# Patient Record
Sex: Male | Born: 1990 | Race: Black or African American | Hispanic: No | Marital: Single | Smoking: Never smoker
Health system: Southern US, Community
[De-identification: ages and names within clinical notes are randomized; demographics above are authoritative.]

## PROBLEM LIST (undated history)

## (undated) DIAGNOSIS — I456 Pre-excitation syndrome: Secondary | ICD-10-CM

## (undated) DIAGNOSIS — R002 Palpitations: Secondary | ICD-10-CM

## (undated) DIAGNOSIS — J45909 Unspecified asthma, uncomplicated: Secondary | ICD-10-CM

## (undated) DIAGNOSIS — R011 Cardiac murmur, unspecified: Secondary | ICD-10-CM

## (undated) HISTORY — DX: Pre-excitation syndrome: I45.6

## (undated) HISTORY — DX: Palpitations: R00.2

---
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
## 2012-05-08 ENCOUNTER — Emergency Department (INDEPENDENT_AMBULATORY_CARE_PROVIDER_SITE_OTHER)
Admission: EM | Admit: 2012-05-08 | Discharge: 2012-05-08 | Disposition: A | Discharge status: Home / Self Care | Payer: Self-pay | Source: Home / Self Care | Attending: Emergency Medicine | Admitting: Emergency Medicine

## 2012-05-08 ENCOUNTER — Encounter (HOSPITAL_COMMUNITY): Payer: Self-pay | Admitting: Emergency Medicine

## 2012-05-08 DIAGNOSIS — F410 Panic disorder [episodic paroxysmal anxiety] without agoraphobia: Secondary | ICD-10-CM

## 2012-05-08 MED ORDER — LORAZEPAM 2 MG/ML IJ SOLN
INTRAMUSCULAR | Status: AC
  Filled 2012-05-08: qty 1

## 2012-05-08 MED ORDER — HYDROXYZINE HCL 25 MG PO TABS: 25.0000 mg | ORAL_TABLET | Freq: Four times a day (QID) | ORAL | Status: DC

## 2012-05-08 MED ORDER — LORAZEPAM 2 MG/ML IJ SOLN
1.0000 mg | Freq: Once | INTRAMUSCULAR | Status: AC
  Administered 2012-05-08: 1 mg via INTRAVENOUS

## 2012-05-08 NOTE — ED Notes (Addendum)
Pt c/o Heart racing and irregular heartbeat onset this morning around 1100. Pt states he was not doing anything physical or stressful. No history of similar symptoms. Has been drinking energy drinks regularly but not in the last 24 hrs. Pt is alert and oriented.

## 2012-05-08 NOTE — ED Provider Notes (Signed)
Chief Complaint:   Chief Complaint  Patient presents with  . Palpitations    History of Present Illness:   [place] Grosser is a 22 year old male who has had a sensation of his heart racing and skipping beats since 11 AM today. The patient states this is still going on right now despite a normal pulse and normal EKG. He's had episodes like this off and on for the past year. He thinks they're sometimes related to stress. He also tried some marijuana and thinks this might have precipitated this attack. The patient states he feels "weird" describing dizziness, nausea, and tremulousness. He feels anxious and somewhat panicky. He denies any chest pain but states he feels "weak" in his left chest and abdomen. He's had no shortness of breath, coughing, or wheezing. No syncope or presyncope. He denies nausea or diaphoresis. There's been no vomiting or abdominal pain. He denies any depression. He states he has felt somewhat anxious about several decisions he has to make. He does not use excessive caffeine and denies any use of recreational drugs. He does drink but no more than once a week although he states he sometimes drinks heavily.  Review of Systems:  Other than noted above, the patient denies any of the following symptoms. Systemic:  No fever, chills, or fatigue. Pulmonary:  No cough, wheezing, shortness of breath. Cardiac:  No chest pain, tightness, pressure, dizziness, presyncope, syncope, PND, orthopnea, or edema. Ext:  No leg pain or swelling. Neuro:  No weakness, paresthesias, or difficulty with speech or gait. Psych:  No anxiety or depression. Endo:  No weight loss, tremor, sweats, or heat intolerance.  PMFSH:  Past medical history, family history, social history, meds, and allergies were reviewed and updated as needed. No history of cardiac disease.  No history of excessive alcohol intake.    Physical Exam:   Vital signs:  BP 131/64  Pulse 105  Temp(Src) 98.2 F (36.8 C) (Oral)  Resp 16   SpO2 100% Gen:  Alert, oriented, in no distress, skin warm and dry. Eye:  PERRL, lids and conjunctivas normal.  No stare or lid lag. ENT:  Mucous membranes moist, pharynx clear. Neck:  Supple, no adenopathy or tenderness.  No JVD.  Thyroid not enlarged. Lungs:  Clear to auscultation, no wheezes, rales or rhonchi.  No respiratory distress. Heart:  Regular rhythm, no extrasystoles.  No gallops, murmers, clicks or rubs. Abdomen:  Soft, nontender, no organomegaly or mass.  Bowel sounds normal.  No pulsatile abdominal mass or bruit. Ext:  No edema. Pulses full and equal. Skin:  Warm and dry.  No rash.  EKG:   Date: 05/08/2012  Rate: 99  Rhythm: sinus arrhythmia  QRS Axis: normal  Intervals: PR shortened  ST/T Wave abnormalities: normal  Conduction Disutrbances:none  Narrative Interpretation: Sinus rhythm with sinus arrhythmia and short PR interval, nonspecific intraventricular conduction block, inferior infarction, age undetermined, cannot rule out anterior infarct, age undetermined.  Old EKG Reviewed: none available  Course in Urgent Care Center:    Given lorazepam 1 mg IM.  Assessment:  The encounter diagnosis was Panic attack.  There is no evidence of atrial or ventricular arrhythmia he does have some changes on his EKG, with a short PR interval, this may predispose to reentrant tachycardia. I recommended a followup with his primary care physician about this and suggested a referral to a cardiologist.  Plan:   1.  The following meds were prescribed:   Discharge Medication List as of 05/08/2012  1:18 PM  START taking these medications   Details  hydrOXYzine (ATARAX/VISTARIL) 25 MG tablet Take 1 tablet (25 mg total) by mouth every 6 (six) hours., Starting 05/08/2012, Until Discontinued, Normal       2.  The patient was instructed in symptomatic care and handouts were given. 3.  The patient was told to return if becoming worse in any way, if no better in 3 or 4 days, and given some  red flag symptoms including syncope, presyncope, dyspnea, or chest pain that would indicate earlier return.  Follow up:  The patient was told to follow up with his pediatrician, suggest he discuss referral to a cardiologist.     Reuben Likes, MD 05/08/12 2151

## 2012-05-10 ENCOUNTER — Telehealth (HOSPITAL_COMMUNITY): Payer: Self-pay | Admitting: Emergency Medicine

## 2012-05-11 ENCOUNTER — Encounter (HOSPITAL_COMMUNITY): Payer: Self-pay | Admitting: Family Medicine

## 2012-05-11 ENCOUNTER — Emergency Department (HOSPITAL_COMMUNITY): Payer: BC Managed Care – PPO

## 2012-05-11 ENCOUNTER — Emergency Department (HOSPITAL_COMMUNITY)
Admission: EM | Admit: 2012-05-11 | Discharge: 2012-05-11 | Disposition: A | Discharge status: Home / Self Care | Payer: BC Managed Care – PPO | Attending: Emergency Medicine | Admitting: Emergency Medicine

## 2012-05-11 DIAGNOSIS — I456 Pre-excitation syndrome: Secondary | ICD-10-CM

## 2012-05-11 DIAGNOSIS — J45909 Unspecified asthma, uncomplicated: Secondary | ICD-10-CM | POA: Insufficient documentation

## 2012-05-11 DIAGNOSIS — R0789 Other chest pain: Secondary | ICD-10-CM | POA: Insufficient documentation

## 2012-05-11 DIAGNOSIS — R42 Dizziness and giddiness: Secondary | ICD-10-CM | POA: Insufficient documentation

## 2012-05-11 LAB — CBC
HCT: 44 % (ref 39.0–52.0)
Hemoglobin: 17.1 g/dL — ABNORMAL HIGH (ref 13.0–17.0)
MCH: 31.8 pg (ref 26.0–34.0)
MCHC: 38.9 g/dL — ABNORMAL HIGH (ref 30.0–36.0)
MCV: 81.8 fL (ref 78.0–100.0)
Platelets: 124 10*3/uL — ABNORMAL LOW (ref 150–400)
RBC: 5.38 MIL/uL (ref 4.22–5.81)
RDW: 13.3 % (ref 11.5–15.5)
WBC: 9.7 10*3/uL (ref 4.0–10.5)

## 2012-05-11 LAB — PRO B NATRIURETIC PEPTIDE: Pro B Natriuretic peptide (BNP): <5 pg/mL (ref 0–125)

## 2012-05-11 LAB — BASIC METABOLIC PANEL
BUN: 11 mg/dL (ref 6–23)
CO2: 29 mEq/L (ref 19–32)
Calcium: 9.9 mg/dL (ref 8.4–10.5)
Chloride: 100 mEq/L (ref 96–112)
Creatinine, Ser: 1.08 mg/dL (ref 0.50–1.35)
GFR calc Af Amer: >90 mL/min (ref >90)
GFR calc non Af Amer: >90 mL/min (ref >90)
Glucose, Bld: 105 mg/dL — ABNORMAL HIGH (ref 70–99)
Potassium: 4.2 mEq/L (ref 3.5–5.1)
Sodium: 138 mEq/L (ref 135–145)

## 2012-05-11 LAB — POCT I-STAT TROPONIN I: Troponin i, poc: 0 ng/mL (ref 0.00–0.08)

## 2012-05-11 MED ORDER — SODIUM CHLORIDE 0.9 % IV SOLN
INTRAVENOUS | Status: DC
  Administered 2012-05-11: 19:00:00 via INTRAVENOUS
  Administered 2012-05-11: 500 mL via INTRAVENOUS

## 2012-05-11 MED ORDER — SODIUM CHLORIDE 0.9 % IV BOLUS (SEPSIS)
500.0000 mL | Freq: Once | INTRAVENOUS | Status: AC
  Administered 2012-05-11: 500 mL via INTRAVENOUS

## 2012-05-11 MED ORDER — LORAZEPAM 2 MG/ML IJ SOLN
1.0000 mg | Freq: Once | INTRAMUSCULAR | Status: AC
  Administered 2012-05-11: 1 mg via INTRAVENOUS
  Filled 2012-05-11 (×2): qty 1

## 2012-05-11 NOTE — ED Notes (Signed)
Per pt sts heart palpitations worse over the weekend. Left sided chest pressure. sts dizziness. Has appt to see cardiology May 13th but was told if worse to come here. sts was told abnormality EKG.

## 2012-05-13 NOTE — ED Provider Notes (Signed)
History     CSN: [id number]  Arrival date & time 05/11/12  1658   First MD Initiated Contact with Patient 05/11/12 1725      Chief Complaint  Patient presents with  . Palpitations    (Consider location/radiation/quality/duration/timing/severity/associated sxs/prior treatment) Patient is a 22 y.o. male presenting with palpitations. The history is provided by the patient and a parent.  Palpitations  This is a recurrent problem. Associated symptoms include dizziness. Pertinent negatives include no fever, no headaches, no back pain and no shortness of breath.  Seen in Urgent Care for simialr complaint on 4/10 and referred to Cardiology - has appointment May 13. Presents with recurrent feeling of palpitations, left chest pressure and some dizziness. No syncope. Pressure in chest is 3/10 and nonradiating and not made worse or better by anything.   Past Medical History  Diagnosis Date  . Asthma     History reviewed. No pertinent past surgical history.  History reviewed. No pertinent family history.  History  Substance Use Topics  . Smoking status: Never Smoker   . Smokeless tobacco: Current User     Comment: hookah pipe daily  . Alcohol Use: 0.0 oz/week    1-2 Cans of beer per week     Comment: 1-2 times a week       Review of Systems  Constitutional: Negative for fever.  HENT: Negative for congestion and neck pain.   Eyes: Negative for visual disturbance.  Respiratory: Positive for chest tightness. Negative for shortness of breath.   Cardiovascular: Positive for palpitations. Negative for leg swelling.  Genitourinary: Negative for hematuria.  Musculoskeletal: Negative for back pain.  Skin: Negative for rash.  Neurological: Positive for dizziness. Negative for headaches.  Hematological: Does not bruise/bleed easily.  Psychiatric/Behavioral: Negative for confusion.    Allergies  Review of patient's allergies indicates no known allergies.  Home Medications   Current  Outpatient Rx  Name  Route  Sig  Dispense  Refill  . hydrOXYzine (ATARAX/VISTARIL) 25 MG tablet   Oral   Take 1 tablet (25 mg total) by mouth every 6 (six) hours.   20 tablet   0   . ibuprofen (ADVIL,MOTRIN) 800 MG tablet   Oral   Take 1 tablet (800 mg total) by mouth every 8 (eight) hours as needed for pain.   15 tablet   0     BP 123/70  Pulse 97  Temp(Src) 99 F (37.2 C) (Oral)  Resp 22  SpO2 100%  Physical Exam  Nursing note and vitals reviewed. Constitutional: He is oriented to person, place, and time. He appears well-developed and well-nourished. No distress.  HENT:  Head: Normocephalic and atraumatic.  Eyes: Conjunctivae and EOM are normal. Pupils are equal, round, and reactive to light.  Neck: Normal range of motion.  Cardiovascular: Normal heart sounds and intact distal pulses.   No murmur heard. Regular tachycardia.  Pulmonary/Chest: Effort normal and breath sounds normal. No respiratory distress.  Abdominal: Soft. Bowel sounds are normal. There is no tenderness.  Musculoskeletal: Normal range of motion. He exhibits no edema.  Neurological: He is alert and oriented to person, place, and time. No cranial nerve deficit. He exhibits normal muscle tone. Coordination normal.  Skin: Skin is warm. No rash noted.    ED Course  Procedures (including critical care time)  Labs Reviewed  CBC - Abnormal; Notable for the following:    Hemoglobin 17.1 (*)    MCHC 38.9 (*)    Platelets 124 (*)  All other components within normal limits  BASIC METABOLIC PANEL - Abnormal; Notable for the following:    Glucose, Bld 105 (*)    All other components within normal limits  PRO B NATRIURETIC PEPTIDE  POCT I-STAT TROPONIN I   Dg Chest Port 1 View  05/11/2012  *RADIOLOGY REPORT*  Clinical Data: Shortness of breath.  PORTABLE CHEST - 1 VIEW  Comparison: None.  Findings: Lungs clear.  Heart size normal.  No pneumothorax or pleural effusion.  IMPRESSION: Negative chest.    Original Report Authenticated By: Holley Dexter, M.D.    Results for orders placed during the hospital encounter of 05/11/12  CBC      Result Value Range   WBC 9.7  4.0 - 10.5 K/uL   RBC 5.38  4.22 - 5.81 MIL/uL   Hemoglobin 17.1 (*) 13.0 - 17.0 g/dL   HCT 40.9  81.1 - 91.4 %   MCV 81.8  78.0 - 100.0 fL   MCH 31.8  26.0 - 34.0 pg   MCHC 38.9 (*) 30.0 - 36.0 g/dL   RDW 78.2  95.6 - 21.3 %   Platelets 124 (*) 150 - 400 K/uL  BASIC METABOLIC PANEL      Result Value Range   Sodium 138  135 - 145 mEq/L   Potassium 4.2  3.5 - 5.1 mEq/L   Chloride 100  96 - 112 mEq/L   CO2 29  19 - 32 mEq/L   Glucose, Bld 105 (*) 70 - 99 mg/dL   BUN 11  6 - 23 mg/dL   Creatinine, Ser 0.86  0.50 - 1.35 mg/dL   Calcium 9.9  8.4 - 57.8 mg/dL   GFR calc non Af Amer >90  >90 mL/min   GFR calc Af Amer >90  >90 mL/min  PRO B NATRIURETIC PEPTIDE      Result Value Range   Pro B Natriuretic peptide (BNP) <5.0  0 - 125 pg/mL  POCT I-STAT TROPONIN I      Result Value Range   Troponin i, poc 0.00  0.00 - 0.08 ng/mL   Comment 3             Date: 05/13/2012  Rate: 95  Rhythm: sinus rhythm  QRS Axis: Nl  Intervals: Nl  ST/T Wave abnormalities: Nonspecific ST and T wave changes consistent with WPW  Conduction Disutrbances: Short PR and prolonged QRS  Narrative Interpretation:   Old EKG Reviewed: No significant change from 05/08/12 This was second EKG here today first suggestive of WPW but had artifact.    1. Wolff-Parkinson-White (WPW) syndrome       MDM  Extensive monitoring in ED and no tachyarrythmia only sinus tachycardia at times and at fastest rate of 120. EKG consistent with WPW and suspect related to patients symptoms. Has cardiology follow up. No immediate need for intervention or medication at this time. CXR negative for pneumonia, pneumothorax, or pulmonary edema.         Shelda Jakes, MD 05/13/12 1351

## 2012-05-19 ENCOUNTER — Encounter: Payer: Self-pay | Admitting: Internal Medicine

## 2012-05-19 ENCOUNTER — Ambulatory Visit (INDEPENDENT_AMBULATORY_CARE_PROVIDER_SITE_OTHER): Payer: BC Managed Care – PPO | Admitting: Internal Medicine

## 2012-05-19 VITALS — BP 141/72 | HR 90 | Ht 77.0 in | Wt 261.0 lb

## 2012-05-19 DIAGNOSIS — R6889 Other general symptoms and signs: Secondary | ICD-10-CM

## 2012-05-19 DIAGNOSIS — R002 Palpitations: Secondary | ICD-10-CM

## 2012-05-19 DIAGNOSIS — I456 Pre-excitation syndrome: Secondary | ICD-10-CM

## 2012-05-19 DIAGNOSIS — Z0001 Encounter for general adult medical examination with abnormal findings: Secondary | ICD-10-CM | POA: Insufficient documentation

## 2012-05-19 NOTE — Patient Instructions (Signed)
Your physician has requested that you have an echocardiogram this week. Echocardiography is a painless test that uses sound waves to create images of your heart. It provides your doctor with information about the size and shape of your heart and how well your heart's chambers and valves are working. This procedure takes approximately one hour. There are no restrictions for this procedure.

## 2012-05-19 NOTE — Assessment & Plan Note (Signed)
As above.

## 2012-05-19 NOTE — Progress Notes (Signed)
ELECTROPHYSIOLOGY CONSULT NOTE  Patient ID: Wayne Gomez, MRN: [id number], DOB/AGE: 18-May-1990 22 y.o. Admit date: (Not on file) Date of Consult: 05/19/2012  Primary Physician: Lyda Perone, MD Primary Cardiologist:  new  Chief Complaint:  WPW   HPI Wayne Gomez is a 22 y.o. male  Seen today with his mother. He comes in having presented to the emergency room placed in the last 2 weeks because of palpitations and chest pain.  His first presentation was associated with palpitations which have largely as needed by the time he arrived. ECG at that time demonstrated sinus rhythm. He was seen a couple of days later again with palpitations. At that time it is felt excision for ongoing. ECG demonstrated sinus rhythm at about 105 beats per minute.  The patient has no history of sustained tachypalpitations. He has no history of syncope. There has been no history of sustained rapid rates following exercise.  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  Past Medical History  Diagnosis Date  . Asthma       Surgical History: No past surgical history on file.   Home Meds: Prior to Admission medications   Medication Sig Start Date End Date Taking? Authorizing Provider  albuterol (PROVENTIL HFA;VENTOLIN HFA) 108 (90 BASE) MCG/ACT inhaler Inhale 2 puffs into the lungs every 6 (six) hours as needed for wheezing.   Yes Historical Provider, MD  ibuprofen (ADVIL,MOTRIN) 800 MG tablet Take 1 tablet (800 mg total) by mouth every 8 (eight) hours as needed for pain. 12/04/11  Yes Trixie Dredge, PA-C    *  Allergies: No Known  Allergies  History   Social History  . Marital Status: Single    Spouse Name: N/A    Number of Children: N/A  . Years of Education: N/A   Occupational History  . Not on file.   Social History Main Topics  . Smoking status: Never Smoker   . Smokeless tobacco: Current User     Comment: hookah pipe daily  . Alcohol Use: 0.0 oz/week    1-2 Cans of beer per week     Comment: 1-2 times a week   . Drug Use: No  . Sexually Active: Not on file   Other Topics Concern  . Not on file   Social History Narrative  . No narrative on file     No family history on file.   ROS:  Please see the history of present illness.   Negative except stress  All other systems reviewed and negative.    Physical Exam:   Blood pressure 141/72, pulse 90, height 6\' 5"  (1.956 m), weight 261 lb (118.389 kg). General: Well developed, well nourished male in no acute distress. Head: Normocephalic, atraumatic, sclera non-icteric, no xanthomas, nares are without discharge. EENT: normal Lymph Nodes:  none Back: without scoliosis/kyphosis , no CVA tendersness Neck: Negative for carotid bruits. JVD not elevated. Lungs: Clear bilaterally to auscultation without wheezes, rales, or rhonchi. Breathing is unlabored. Heart: RRR with S1 S2. 2 /6 systolic murmur  Some increase with respiration, rubs, or gallops appreciated.  Abdomen: Soft, non-tender, non-distended with normoactive bowel sounds. No hepatomegaly. No rebound/guarding. No obvious abdominal masses. Msk:  Strength and tone appear normal for age. Extremities: No clubbing or cyanosis. No edema.  Distal pedal pulses are 2+ and equal bilaterally. Skin: Warm and Dry Neuro: Alert and oriented X 3. CN III-XII intact Grossly normal sensory and motor function . Psych:  Responds to questions appropriately with a normal affect.      Labs: Cardiac Enzymes No results found for this basename: CKTOTAL, CKMB, TROPONINI,  in the last 72 hours CBC Lab Results    Component Value Date   WBC 9.7 05/11/2012   HGB 17.1* 05/11/2012   HCT 44.0 05/11/2012   MCV 81.8 05/11/2012   PLT 124* 05/11/2012   PROTIME: No results found for this basename: LABPROT, INR,  in the last 72 hours Chemistry No results found for this basename: NA, K, CL, CO2, BUN, CREATININE, CALCIUM, LABALBU, PROT, BILITOT, ALKPHOS, ALT, AST, GLUCOSE,  in the last 168 hours Lipids No results found for this basename: CHOL, HDL, LDLCALC, TRIG   BNP Pro B Natriuretic peptide (BNP)  Date/Time Value Range Status  05/11/2012  5:28 PM <5.0  0 - 125 pg/mL Final   Miscellaneous No results found for this basename: DDIMER    Radiology/Studies:  Dg Chest Port 1 View  05/11/2012  *RADIOLOGY REPORT*  Clinical Data: Shortness of breath.  PORTABLE CHEST - 1 VIEW  Comparison: None.  Findings: Lungs clear.  Heart size normal.  No pneumothorax or pleural effusion.  IMPRESSION: Negative chest.   Original Report Authenticated By: Holley Dexter, M.D.     EKG: sinus rhythm with ventricular preexcitation positive delta waves are noted in 23 and F. And in leads V4. Negative delta waves are noted in lead aVR.  Assessment and Plan:    Sherryl Manges

## 2012-05-19 NOTE — Assessment & Plan Note (Signed)
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  He has had some palpitations although it is not clear to me that these are related to his pathway based on the rate noted on electrocardiograms as well as his symptoms although he does note that when he presented to hospital 4/10 and his symptoms had gradually abated  He has a murmur which changes with respiration suggesting a right-sided origin. The concern in the context of preexcitation and Epstein's anomaly. We'll undertake an echo to look for this.  We have discussed the issues of ventricular preexcitation the presence or absence of palpitations, i.e. Isn't symptomatic or not. We have discussed the risk of catheter ablation as well as the risks associated with symptomatic ventricular preexcitation. He would like to pursue catheter ablation. We will plan to discuss this with Dr. Sharrell Ku. It will be important to understand right-sided anatomy prior to that procedure

## 2012-05-19 NOTE — Assessment & Plan Note (Addendum)
As above  there is some suggestion that there is an anxiety component to this.We'll echocardiogram that we do have are all consistent with sinus rhythm at various rates

## 2012-05-20 ENCOUNTER — Ambulatory Visit (HOSPITAL_COMMUNITY): Payer: BC Managed Care – PPO | Attending: Cardiovascular Disease | Admitting: Radiology

## 2012-05-20 DIAGNOSIS — I379 Nonrheumatic pulmonary valve disorder, unspecified: Secondary | ICD-10-CM | POA: Insufficient documentation

## 2012-05-20 DIAGNOSIS — R002 Palpitations: Secondary | ICD-10-CM | POA: Insufficient documentation

## 2012-05-20 DIAGNOSIS — I456 Pre-excitation syndrome: Secondary | ICD-10-CM

## 2012-05-20 DIAGNOSIS — I059 Rheumatic mitral valve disease, unspecified: Secondary | ICD-10-CM | POA: Insufficient documentation

## 2012-05-20 DIAGNOSIS — I079 Rheumatic tricuspid valve disease, unspecified: Secondary | ICD-10-CM | POA: Insufficient documentation

## 2012-05-20 NOTE — Progress Notes (Signed)
Echocardiogram performed.  

## 2012-05-21 ENCOUNTER — Institutional Professional Consult (permissible substitution): Payer: BC Managed Care – PPO | Admitting: Internal Medicine

## 2012-05-22 ENCOUNTER — Ambulatory Visit: Payer: BC Managed Care – PPO | Admitting: Cardiovascular Disease

## 2012-05-26 ENCOUNTER — Telehealth: Payer: Self-pay | Admitting: Internal Medicine

## 2012-05-26 NOTE — Telephone Encounter (Signed)
Echo read by Dr. Graciela Husbands as "pretty normal" with a little thickening of the heart muscle. Will follow. I sent Dr. Graciela Husbands a message to call me about the next steps for the patient. I have not heard anything back from him yet. I will forward to Deliah Goody, RN to ask Dr. Graciela Husbands tomorrow what the next step will be then call the patient's mother.

## 2012-05-26 NOTE — Telephone Encounter (Signed)
New Problem:    Patient's mother called in wanting to know the results of her son's latest ECHO.  Please call back.

## 2012-05-27 ENCOUNTER — Telehealth: Payer: Self-pay | Admitting: Cardiovascular Disease

## 2012-05-27 NOTE — Telephone Encounter (Signed)
New Prob     Calling to follow up on pts ECHO results from 4/22. Would like to speak to nurse.

## 2012-05-27 NOTE — Telephone Encounter (Signed)
Left a message to call back.

## 2012-05-28 NOTE — Telephone Encounter (Signed)
Dr Ladona Ridgel would like to see this patient in the office prior to being scheduled.  i tried to call mom in followup of the last message thanks

## 2012-05-28 NOTE — Telephone Encounter (Signed)
The patient's mother is aware of his echo results. I made her aware that Dr. Ladona Ridgel could see the patient on 5/6 at 1:45 pm. She will call back to confirm this is ok. I have explained she could talk with Melissa to confirm.

## 2012-05-28 NOTE — Telephone Encounter (Signed)
Follow up    Pt's mom stated she received vm to call triage today concerning pt's Echo. Please call pt's mom

## 2012-05-28 NOTE — Telephone Encounter (Signed)
New problem    Patient calling back    Status of upcoming surgery.

## 2012-05-28 NOTE — Telephone Encounter (Signed)
Spoke with Wayne Gomez, aware dr Graciela Husbands is in the office today and we will call him back once dr Graciela Husbands has reviewed his information. Wayne Gomez voiced understanding.

## 2012-06-03 ENCOUNTER — Encounter: Payer: Self-pay | Admitting: Internal Medicine

## 2012-06-03 ENCOUNTER — Ambulatory Visit (INDEPENDENT_AMBULATORY_CARE_PROVIDER_SITE_OTHER): Payer: BC Managed Care – PPO | Admitting: Internal Medicine

## 2012-06-03 VITALS — BP 130/86 | HR 92 | Ht 77.0 in | Wt 258.4 lb

## 2012-06-03 DIAGNOSIS — R002 Palpitations: Secondary | ICD-10-CM

## 2012-06-03 MED ORDER — ATENOLOL 50 MG PO TABS: 50.0000 mg | ORAL_TABLET | Freq: Every day | ORAL | Status: DC

## 2012-06-03 NOTE — Patient Instructions (Signed)
Your physician recommends that you schedule a follow-up appointment in: 3 months with Dr Ladona Ridgel Please call us regarding the ablation  Your physician has recommended you make the following change in your medication: START Atenolol 50 mg - take 1/2 tablet daily for 1 week then 1 tablet daily after

## 2012-06-03 NOTE — Progress Notes (Signed)
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 The patient's health is been good. Several weeks ago, he began to experience palpitations. An ECG was performed which demonstrated sinus rhythm with WPW syndrome. He is now referred for additional valuation and consideration for catheter ablation. The patient has never had documented SVT or atrial fibrillation. He does have symptoms were refill his heart is racing and will start and stop suddenly.  The etiology of his symptoms is unclear. He does have manifest preexcitation on electrocardiogram. 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 He has never had syncope. He does admit to dietary indiscretion with caffeine and alcohol. No Known Allergies   Current Outpatient Prescriptions  Medication Sig Dispense Refill  . albuterol (PROVENTIL HFA;VENTOLIN HFA) 108 (90 BASE) MCG/ACT inhaler Inhale 2 puffs into the lungs every 6 (six) hours as needed for wheezing.      . ibuprofen (ADVIL,MOTRIN) 800 MG tablet Take 1 tablet (800 mg total) by mouth every 8 (eight) hours as needed for pain.  15 tablet  0  . atenolol (TENORMIN) 50 MG tablet Take 1 tablet (50 mg total) by mouth daily.  30 tablet  3   No current facility-administered medications for this visit.     Past Medical History  Diagnosis Date  . Asthma     ROS:   All systems reviewed and negative except as noted in the HPI.   History reviewed. No pertinent past surgical history.   No family history on file.   History   Social History  . Marital Status: Single    Spouse Name: N/A    Number of Children: N/A  . Years of Education: N/A   Occupational History  . Not on file.   Social History Main Topics  . Smoking status: Never Smoker   . Smokeless tobacco: Former User    Quit date: 04/28/2012     Comment: hookah pipe daily  . Alcohol Use: 0.0 oz/week    1-2 Cans of beer per week     Comment: 1-2 times a week   . Drug Use: No  . Sexually Active: Not on  file   Other Topics Concern  . Not on file   Social History Narrative  . No narrative on file     BP 130/86  Pulse 92  Ht 6' 5" (1.956 m)  Wt 258 lb 6.4 oz (117.209 kg)  BMI 30.64 kg/m2  Physical Exam:  Large, Well appearing young man,NAD HEENT: Unremarkable Neck:  No JVD, no thyromegally Lungs:  Clear with no wheezes, rales, or rhonchi. HEART:  Regular rate rhythm, no murmurs, no rubs, no clicks Abd:  soft, positive bowel sounds, no organomegally, no rebound, no guarding Ext:  2 plus pulses, no edema, no cyanosis, no clubbing Skin:  No rashes no nodules Neuro:  CN II through XII intact, motor grossly intact  EKG - normal sinus rhythm with ventricular preexcitation, QRS duration 150 ms.   Assess/Plan: 

## 2012-06-03 NOTE — Assessment & Plan Note (Signed)
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 He clearly has ventricular preexcitation on his ECG. Today we discussed the treatment options. Specifically, medical therapy with a beta blocker versus catheter ablation were reviewed with the risk and benefits, goals and expectations, of each treatment reviewed. While he is leaning towards catheter ablation, he would like to undergo a trial of medical therapy which I agree with. He'll be prescribed atenolol 25 mg a day increasing to 50 mg a day over the next few weeks. If he becomes intolerant to this medication or develop symptoms,  And we will plan on proceeding with ablation. Finally, he is warned against caffeine and alcohol and I have asked that he not use either.

## 2012-06-10 ENCOUNTER — Ambulatory Visit: Payer: Self-pay | Admitting: Cardiovascular Disease

## 2012-06-12 ENCOUNTER — Telehealth: Payer: Self-pay | Admitting: Internal Medicine

## 2012-06-12 DIAGNOSIS — I471 Supraventricular tachycardia: Secondary | ICD-10-CM

## 2012-06-12 NOTE — Telephone Encounter (Signed)
New problem   Pt want to talk to you about having a WPW. Please call pt

## 2012-06-13 NOTE — Telephone Encounter (Signed)
Spoke with patient and he is going to discuss available days with his mom and call me back monday

## 2012-06-16 NOTE — Telephone Encounter (Signed)
New Problem:    Patient called in ready to schedule his Cath date.  Please call back.

## 2012-06-16 NOTE — Telephone Encounter (Signed)
New problem     Father calling from [place]    Want to know the cost of ablation.

## 2012-06-17 NOTE — Telephone Encounter (Signed)
Spoke with step-dad, he is going to call me back tomorrow to discuss dates for procedure

## 2012-06-18 NOTE — Telephone Encounter (Signed)
Follow Up      Pt is wanting to schedule an ablation. Would like to speak to nurse.

## 2012-06-18 NOTE — Telephone Encounter (Signed)
Left message with patient's step father to have him call me to schedule procedure

## 2012-06-18 NOTE — Telephone Encounter (Signed)
lmom for patient to call me back.  I have left on his voicemail the available dates between now and end of June.  This way he can look at dates and let me know which is best for him

## 2012-06-20 NOTE — Telephone Encounter (Signed)
Follow up   ° ° °Patient calling back to set up ablation.   °

## 2012-06-20 NOTE — Telephone Encounter (Signed)
Follow Up    Notified pt of May 30 tentative date per Tresa Endo. Told pt she will be getting a call from nurse.

## 2012-06-20 NOTE — Telephone Encounter (Signed)
Follow up  Pt wants to know if his ablation can be scheduled for May 30?

## 2012-06-24 ENCOUNTER — Other Ambulatory Visit: Payer: Self-pay | Admitting: *Deleted

## 2012-06-24 ENCOUNTER — Encounter: Payer: Self-pay | Admitting: *Deleted

## 2012-06-25 ENCOUNTER — Other Ambulatory Visit (INDEPENDENT_AMBULATORY_CARE_PROVIDER_SITE_OTHER): Payer: BC Managed Care – PPO

## 2012-06-25 DIAGNOSIS — I498 Other specified cardiac arrhythmias: Secondary | ICD-10-CM

## 2012-06-25 DIAGNOSIS — I471 Supraventricular tachycardia: Secondary | ICD-10-CM

## 2012-06-25 LAB — CBC WITH DIFFERENTIAL/PLATELET
Basophils Relative: 0.2 % (ref 0.0–3.0)
Eosinophils Absolute: 0.3 10*3/uL (ref 0.0–0.7)
Eosinophils Relative: 4 % (ref 0.0–5.0)
HCT: 48.1 % (ref 39.0–52.0)
Lymphs Abs: 1.8 10*3/uL (ref 0.7–4.0)
MCHC: 34.2 g/dL (ref 30.0–36.0)
MCV: 90.8 fl (ref 78.0–100.0)
Monocytes Absolute: 0.7 10*3/uL (ref 0.1–1.0)
RBC: 5.29 Mil/uL (ref 4.22–5.81)
WBC: 7.2 10*3/uL (ref 4.5–10.5)

## 2012-06-25 LAB — BASIC METABOLIC PANEL
BUN: 11 mg/dL (ref 6–23)
CO2: 27 mEq/L (ref 19–32)
Chloride: 104 mEq/L (ref 96–112)
Potassium: 4.1 mEq/L (ref 3.5–5.1)

## 2012-06-26 ENCOUNTER — Encounter (HOSPITAL_COMMUNITY): Payer: Self-pay | Admitting: Pharmacy Technician

## 2012-06-27 ENCOUNTER — Encounter (HOSPITAL_COMMUNITY)
Admission: RE | Disposition: A | Discharge status: Home / Self Care | Payer: Self-pay | Source: Ambulatory Visit | Attending: Internal Medicine

## 2012-06-27 ENCOUNTER — Ambulatory Visit (HOSPITAL_COMMUNITY)
Admission: RE | Admit: 2012-06-27 | Discharge: 2012-06-28 | Disposition: A | Discharge status: Home / Self Care | Payer: BC Managed Care – PPO | Source: Ambulatory Visit | Attending: Internal Medicine | Admitting: Internal Medicine

## 2012-06-27 ENCOUNTER — Encounter (HOSPITAL_COMMUNITY): Payer: Self-pay | Admitting: General Practice

## 2012-06-27 DIAGNOSIS — J45909 Unspecified asthma, uncomplicated: Secondary | ICD-10-CM | POA: Insufficient documentation

## 2012-06-27 DIAGNOSIS — I456 Pre-excitation syndrome: Secondary | ICD-10-CM

## 2012-06-27 DIAGNOSIS — I498 Other specified cardiac arrhythmias: Secondary | ICD-10-CM | POA: Insufficient documentation

## 2012-06-27 HISTORY — PX: SUPRAVENTRICULAR TACHYCARDIA ABLATION: SHX5492

## 2012-06-27 HISTORY — DX: Pre-excitation syndrome: I45.6

## 2012-06-27 HISTORY — PX: SUPRAVENTRICULAR TACHYCARDIA ABLATION: SHX6106

## 2012-06-27 SURGERY — SUPRAVENTRICULAR TACHYCARDIA ABLATION
Anesthesia: LOCAL

## 2012-06-27 MED ORDER — MIDAZOLAM HCL 5 MG/5ML IJ SOLN
INTRAMUSCULAR | Status: AC
  Filled 2012-06-27: qty 5

## 2012-06-27 MED ORDER — FENTANYL CITRATE 0.05 MG/ML IJ SOLN
INTRAMUSCULAR | Status: AC
  Filled 2012-06-27: qty 2

## 2012-06-27 MED ORDER — SODIUM CHLORIDE 0.9 % IJ SOLN: 3.0000 mL | INTRAMUSCULAR | Status: DC | PRN

## 2012-06-27 MED ORDER — HEPARIN (PORCINE) IN NACL 2-0.9 UNIT/ML-% IJ SOLN
INTRAMUSCULAR | Status: AC
  Filled 2012-06-27: qty 500

## 2012-06-27 MED ORDER — SODIUM CHLORIDE 0.9 % IJ SOLN
3.0000 mL | Freq: Two times a day (BID) | INTRAMUSCULAR | Status: DC
  Administered 2012-06-27: 3 mL via INTRAVENOUS

## 2012-06-27 MED ORDER — ONDANSETRON HCL 4 MG/2ML IJ SOLN: 4.0000 mg | Freq: Four times a day (QID) | INTRAMUSCULAR | Status: DC | PRN

## 2012-06-27 MED ORDER — ACETAMINOPHEN 325 MG PO TABS
650.0000 mg | ORAL_TABLET | ORAL | Status: DC | PRN
  Administered 2012-06-28: 650 mg via ORAL
  Filled 2012-06-27: qty 2

## 2012-06-27 MED ORDER — SODIUM CHLORIDE 0.9 % IV SOLN: 250.0000 mL | INTRAVENOUS | Status: DC | PRN

## 2012-06-27 MED ORDER — BUPIVACAINE HCL (PF) 0.25 % IJ SOLN
INTRAMUSCULAR | Status: AC
  Filled 2012-06-27: qty 60

## 2012-06-27 NOTE — Op Note (Signed)
EPS/RFA of a manifest right lateral accessory pathway without immediate complication. J#[id number].

## 2012-06-27 NOTE — Progress Notes (Signed)
Utilization Review Completed.Sharnee Douglass T5/30/2014  

## 2012-06-27 NOTE — H&P (View-Only) (Signed)
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 The patient's health is been good. Several weeks ago, he began to experience palpitations. An ECG was performed which demonstrated sinus rhythm with WPW syndrome. He is now referred for additional valuation and consideration for catheter ablation. The patient has never had documented SVT or atrial fibrillation. He does have symptoms were refill his heart is racing and will start and stop suddenly.  The etiology of his symptoms is unclear. He does have manifest preexcitation on electrocardiogram. 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 He has never had syncope. He does admit to dietary indiscretion with caffeine and alcohol. No Known Allergies   Current Outpatient Prescriptions  Medication Sig Dispense Refill  . albuterol (PROVENTIL HFA;VENTOLIN HFA) 108 (90 BASE) MCG/ACT inhaler Inhale 2 puffs into the lungs every 6 (six) hours as needed for wheezing.      Marland Kitchen ibuprofen (ADVIL,MOTRIN) 800 MG tablet Take 1 tablet (800 mg total) by mouth every 8 (eight) hours as needed for pain.  15 tablet  0  . atenolol (TENORMIN) 50 MG tablet Take 1 tablet (50 mg total) by mouth daily.  30 tablet  3   No current facility-administered medications for this visit.     Past Medical History  Diagnosis Date  . Asthma     ROS:   All systems reviewed and negative except as noted in the HPI.   History reviewed. No pertinent past surgical history.   No family history on file.   History   Social History  . Marital Status: Single    Spouse Name: N/A    Number of Children: N/A  . Years of Education: N/A   Occupational History  . Not on file.   Social History Main Topics  . Smoking status: Never Smoker   . Smokeless tobacco: Former Neurosurgeon    Quit date: 04/28/2012     Comment: hookah pipe daily  . Alcohol Use: 0.0 oz/week    1-2 Cans of beer per week     Comment: 1-2 times a week   . Drug Use: No  . Sexually Active: Not on  file   Other Topics Concern  . Not on file   Social History Narrative  . No narrative on file     BP 130/86  Pulse 92  Ht 6\' 5"  (1.956 m)  Wt 258 lb 6.4 oz (117.209 kg)  BMI 30.64 kg/m2  Physical Exam:  Large, Well appearing young man,NAD HEENT: Unremarkable Neck:  No JVD, no thyromegally Lungs:  Clear with no wheezes, rales, or rhonchi. HEART:  Regular rate rhythm, no murmurs, no rubs, no clicks Abd:  soft, positive bowel sounds, no organomegally, no rebound, no guarding Ext:  2 plus pulses, no edema, no cyanosis, no clubbing Skin:  No rashes no nodules Neuro:  CN II through XII intact, motor grossly intact  EKG - normal sinus rhythm with ventricular preexcitation, QRS duration 150 ms.   Assess/Plan:

## 2012-06-27 NOTE — Interval H&P Note (Signed)
History and Physical Interval Note:  06/27/2012 10:31 AM  Wayne Gomez  has presented today for surgery, with the diagnosis of SVT  The various methods of treatment have been discussed with the patient and family. After consideration of risks, benefits and other options for treatment, the patient has consented to  Procedure(s): SUPRAVENTRICULAR TACHYCARDIA ABLATION (N/A) as a surgical intervention .  The patient's history has been reviewed, patient examined, no change in status, stable for surgery.  I have reviewed the patient's chart and labs.  Questions were answered to the patient's satisfaction.     [name]

## 2012-06-28 ENCOUNTER — Ambulatory Visit (HOSPITAL_COMMUNITY): Payer: BC Managed Care – PPO

## 2012-06-28 ENCOUNTER — Encounter (HOSPITAL_COMMUNITY): Payer: Self-pay | Admitting: Physician Assistant

## 2012-06-28 DIAGNOSIS — I456 Pre-excitation syndrome: Secondary | ICD-10-CM

## 2012-06-28 NOTE — Op Note (Signed)
Wayne Gomez, Wayne Gomez NO.:  [id number]  MEDICAL RECORD NO.:  [id number]  LOCATION:  3W14C                        FACILITY:  [facility]  PHYSICIAN:  Doylene Canning. Ladona Ridgel, MD    DATE OF BIRTH:  22-Oct-1990  DATE OF PROCEDURE:  06/27/2012 DATE OF DISCHARGE:                              OPERATIVE REPORT   PROCEDURE PERFORMED:  Electrophysiologic study and RF catheter ablation of a manifest right lateral accessory pathway.  INTRODUCTION:  The patient is a very pleasant 22 year old male with recurrent tachy palpitations and EKG demonstrating manifest WPW syndrome.  He is now referred for electrophysiologic study and catheter ablation as he has been intolerant of medical therapy with beta- blockers.  PROCEDURE:  After informed consent was obtained, the patient was taken to the diagnostic EP lab in a fasting state.  After usual preparation and draping, intravenous fentanyl and midazolam was given for sedation. A 6-French hexapolar catheter was inserted percutaneously in the right jugular vein and advanced to the coronary sinus.  A 6-French quadripolar catheter was inserted percutaneously in the right femoral vein and advanced to the right ventricle.  A 6-French quadripolar catheter was inserted percutaneously in the right femoral vein and advanced to His bundle region.  After measurement of the basic intervals, rapid ventricular pacing was carried out from the right ventricle demonstrating VA dissociation at 600 milliseconds.  This confirmed the presence of an antegrade only accessory pathway.  Programmed ventricular stimulation was carried out from the ventricle at 600 milliseconds and demonstrated VA dissociation.  Next, programmed atrial stimulation was carried out from the coronary sinus demonstrating manifest pre- excitation.  Programmed atrial stimulation was carried out at a base drive cycle length of 811 milliseconds and the S1-S2 interval stepwise decreased down to 300  milliseconds where the AV node ERP was observed. It should be noted that at 380 milliseconds, accessory pathway conduction resolved as well.  Next, rapid atrial pacing was carried out from the coronary sinus at base drive cycle length of 914 milliseconds and stepwise decreased down to 310 milliseconds where Wenckebach of the antegrade accessory pathway was demonstrated.  Prior to developing AV Wenckebach, there was maximal pre-excitation.  At this point, a 7-French 20 pole halo catheter was inserted percutaneously in the right femoral vein and the right ventricular catheter was removed.  Initially, the halo catheter was placed on the atrial side of the tricuspid valve annulus.  It was very difficult to map ventricular activation.  The goal of course would be to map the earliest ventricular activation of the accessory pathway as the atrial activation could not be mapped because of the patient's antegrade only accessory pathway.  The 7-French quadripolar ablation catheter was then inserted percutaneously in the right femoral vein and advanced under fluoroscopic guidance into the right atrium.  Mapping of the accessory pathway was carried out.  It was along the tricuspid valve annulus at approximately 9 o'clock in the LAO projection.  Initial RF energy application came on and resulted in termination of accessory pathway conduction, however, catheter stability was difficult and the accessory pathway conduction came back. Additional manipulation of the catheter was then carried out and additional RF energy application  delivered resulted in resolution of accessory pathway conduction.  At this point, the patient was observed for approximately 12 minutes.  After 12 minutes of observation, the accessory pathway conduction returned.  At this point, I placed an 8- [place] sheath into the femoral vein, removing the previous 8-French sheath.  The new sheath was a long sheath and was utilized to  advance the halo catheter into the right ventricle and attempts were made to map the ventricular side of the tricuspid valve annulus.  Catheter stability was a problem, but ultimately we got reasonable catheter stability and could map the ventricular activation of the accessory pathway.  In the same way, the ablation catheter was inserted into a long SL1 sheath and advanced under fluoroscopic guidance into the right ventricle.  The ventricular as well as atrial insertions of the accessory pathway were mapped and at the final site, where successful RF energy application was delivered, the patient's accessory pathway was terminated and no additional accessory pathway conduction was demonstrated.  This location was approximately between 9 and 10 o'clock on the tricuspid valve annulus.  The patient was observed for 30 minutes and had no accessory pathway conduction.  The catheters were removed and the patient was returned to the recovery area in satisfactory condition to undergo sheath removal.  COMPLICATIONS:  There were no immediate procedure complications.  RESULTS:  A.  Baseline ECG.  Baseline ECG demonstrates sinus rhythm with normal axis and prolonged QRS duration. B.  Baseline intervals.  Sinus node cycle length was 600 milliseconds. The QRS duration was initially 140 milliseconds.  Following ablation, it was 90 milliseconds and the HV interval following ablation was 41 milliseconds. C.  Rapid ventricular pacing.  Rapid ventricular pacing was carried out from the right ventricle demonstrating VA dissociation at 600 milliseconds. D.  Programmed ventricular stimulation.  Programmed ventricular stimulation was carried out from the right ventricle demonstrating VA dissociation at 600 milliseconds. E.  Rapid atrial pacing.  Rapid atrial pacing was carried out from the atrium demonstrating a pathway Wenckebach at 310 milliseconds. Following ablation, the AV node Wenckebach was 340  milliseconds. F.  Programmed atrial stimulation.  Programmed atrial stimulation was carried out from the atrium at a base drive cycle length of 098 milliseconds, stepwise decreased down to 300 milliseconds where atrial refractoriness was observed.  During programmed atrial stimulation, there were no AH jumps, no echo beats, and no inducible SVT. G.  Arrhythmias observed. 1. Atrial fibrillation.  Initiation was done with RF energy     application.  The duration was sustained, termination was     spontaneous.     a.     Mapping.  Mapping of the accessory pathway demonstrated      accessory pathway to be located between 9 and 10 o'clock in the      LAO projection along the tricuspid valve annulus.     b.     RF energy application.  A total of 6 RF energy applications      were delivered to the accessory pathway resulting in rendering of      accessory pathway nonconducting.  There was no residual      ventricular pre-excitation.  CONCLUSION:  Study demonstrates successful electrophysiologic study and RF catheter ablation of a manifest right lateral accessory pathway resulting in resolution of the patient's accessory pathway conduction.     Doylene Canning. Ladona Ridgel, MD     GWT/MEDQ  D:  06/27/2012  T:  06/28/2012  Job:  [id number]  cc:   Duke Salvia, MD, [facility]

## 2012-06-28 NOTE — Progress Notes (Signed)
   PROGRESS NOTE  Subjective:   Wayne Gomez is a 22 yo with WPW.  S/p successful ablation.     Objective:    Vital Signs:   Temp:  [98 F (36.7 C)-98.7 F (37.1 C)] 98.7 F (37.1 C) (05/31 0621) Pulse Rate:  [92-111] 92 (05/31 0621) Resp:  [17-20] 20 (05/31 0621) BP: (116-152)/(68-93) 116/74 mmHg (05/31 0621) SpO2:  [95 %-99 %] 98 % (05/31 0621) Weight:  [264 lb (119.75 kg)] 264 lb (119.75 kg) (05/30 1452)      24-hour weight change: Weight change:   Weight trends: Filed Weights   06/27/12 0749 06/27/12 1452  Weight: 258 lb (117.028 kg) 264 lb (119.75 kg)    Intake/Output:  05/30 0701 - 05/31 0700 In: 3 [I.V.:3] Out: -      Physical Exam: BP 116/74  Pulse 92  Temp(Src) 98.7 F (37.1 C) (Oral)  Resp 20  Ht 6\' 5"  (1.956 m)  Wt 264 lb (119.75 kg)  BMI 31.3 kg/m2  SpO2 98%  General: Vital signs reviewed and noted.   Head: Normocephalic, atraumatic.  Eyes: conjunctivae/corneas clear.  EOM's intact.   Throat: normal  Neck:  normal R IJ area bandaged  Lungs:  clear  Heart:  RR, normal S1, S2  Abdomen:  Soft, non-tender, non-distended    Extremities: No edema   Neurologic: A&O X3, CN II - XII are grossly intact.   Psych: Normal     Labs: BMET: No results found for this basename: NA, K, CL, CO2, GLUCOSE, BUN, CREATININE, CALCIUM, MG, PHOS,  in the last 72 hours  Liver function tests: No results found for this basename: AST, ALT, ALKPHOS, BILITOT, PROT, ALBUMIN,  in the last 72 hours No results found for this basename: LIPASE, AMYLASE,  in the last 72 hours  CBC: No results found for this basename: WBC, NEUTROABS, HGB, HCT, MCV, PLT,  in the last 72 hours  Cardiac Enzymes: No results found for this basename: CKTOTAL, CKMB, TROPONINI,  in the last 72 hours  Coagulation Studies: No results found for this basename: LABPROT, INR,  in the last 72 hours  Other:  Other results:  Tele:  NSR  Medications:    Infusions:    Scheduled Medications: .  sodium chloride  3 mL Intravenous Q12H    Assessment/ Plan:    1. WPW:  S/p successful ablation of his bypass tract.  ECG shows resolution of his delta wave.  I have ordered a CXR.   Continue same meds    Disposition: DC to home Length of Stay: 1  Vesta Mixer, Montez Hageman., MD, [facility] 06/28/2012, 10:11 AM Office [phone] Pager [phone]

## 2012-06-28 NOTE — Discharge Summary (Signed)
Discharge Summary   Patient ID: Wayne Gomez MRN: [id number], DOB/AGE: 18-May-1990 22 y.o. Admit date: 06/27/2012 D/C date:     06/28/2012  Primary Cardiologist: Ladona Ridgel  Primary Discharge Diagnoses:  1. WPW s/p ablation 06/27/12   Secondary Discharge Diagnoses:  1. Asthma  Hospital Course: Wayne Gomez is a 22 y/o M with history of asthma who was recently evaluated by Dr. Ladona Ridgel for palpitations and found to have WPW. The patient has never had documented SVT or atrial fibrillation. He does have symptoms of heart racing that start and stop suddenly. Ablation was recommended. He underwent electrophysiologic study and RF catheter ablation of a manifest right lateral accessory pathway on 06/27/12. He tolerated this procedure well without complication. Dr. Elease Hashimoto has seen and examined him today and feels he is stable for discharge. The patient is not currently working so will not need a work note. I have left a message on our office's scheduling voicemail requesting a follow-up appointment, and our office will call the patient with this appointment.  Radiology is having trouble getting his CXR results to load in epic, but per discussion with their department, the CXR shows no acute disease.  Discharge Vitals: Blood pressure 116/74, pulse 92, temperature 98.7 F (37.1 C), temperature source Oral, resp. rate 20, height 6\' 5"  (1.956 m), weight 264 lb (119.75 kg), SpO2 98.00%.  Labs: None this admission  Diagnostic Studies/Procedures   EPS/RFA as above  Discharge Medications     Medication List    TAKE these medications       albuterol 108 (90 BASE) MCG/ACT inhaler  Commonly known as:  PROVENTIL HFA;VENTOLIN HFA  Inhale 2 puffs into the lungs every 6 (six) hours as needed for wheezing.     cetirizine 10 MG tablet  Commonly known as:  ZYRTEC  Take 10 mg by mouth daily.     loratadine 10 MG tablet  Commonly known as:  CLARITIN  Take 10 mg by mouth daily.        Disposition    The patient will be discharged in stable condition to home. Discharge Orders   Future Appointments Provider Department Dept Phone   09/04/2012 8:45 AM Marinus Maw, MD [facility] Main Office [place]) [phone]   Future Orders Complete By Expires     Diet - low sodium heart healthy  As directed     Discharge instructions  As directed     Comments:      Zyrtec and Claritin are 2 medications in the same class (antihistamines) and are typically not taken together. Ask your primary doctor if you should continue both.    Increase activity slowly  As directed     Comments:      No driving for 2 days. No lifting over 5 lbs for 1 week. No sexual activity for 1 week. Keep procedure site clean & dry. If you notice increased pain, swelling, bleeding or pus, call/return!  You may shower, but no soaking baths/hot tubs/pools for 1 week.      Follow-up Information   Follow up with [name], MD. (Our office will call you for a follow-up appointment. Please call the office if you have not heard from [place] within 4 days.)    Contact information:   [address] [place] [address] [phone]         Duration of Discharge Encounter: Greater than 30 minutes including physician and PA time.  Signed, Dayna Dunn PA-C 06/28/2012, 10:59 AM  Attending Note:   The patient was seen and examined.  Agree with assessment and plan as noted above.  Changes made to the above note as needed.  See note from day of discharge.   Vesta Mixer, Montez Hageman., MD, [facility] 06/29/2012, 7:46 AM

## 2012-06-30 ENCOUNTER — Telehealth: Payer: Self-pay | Admitting: Internal Medicine

## 2012-06-30 NOTE — Telephone Encounter (Signed)
New Problem  Pt states he had an ablation on 5.30.14 and was advised to call to schedule appt for a follow up. He states he is leaving for [place] on June 14 and he wants to be seen before he leaves. Dr Ladona Ridgel does not have anything before then and neither does the PAs . He states he would prefer to see Dr Ladona Ridgel. He asked if you could call him back.

## 2012-06-30 NOTE — Telephone Encounter (Signed)
Pt had an ablation on 06/27/12. He called today to make an appointment for post ablation visit. There is no appointments available with Dr. Ladona Ridgel or the PA until 07/16/12. Pt is leaving to [place] 07/12/12, And would like to be seen before then. Pt will be in [place] for 3 months. Pt would like to know if he is not able to be seen post ablation before he leaves, does he needs to get a Cardiologist there or can  he  wait till he return to [place] to be seen then.

## 2012-07-01 NOTE — Telephone Encounter (Signed)
Scheduled for GT 07/08/12

## 2012-07-08 ENCOUNTER — Ambulatory Visit (INDEPENDENT_AMBULATORY_CARE_PROVIDER_SITE_OTHER): Payer: BC Managed Care – PPO | Admitting: Internal Medicine

## 2012-07-08 ENCOUNTER — Encounter: Payer: Self-pay | Admitting: Internal Medicine

## 2012-07-08 VITALS — BP 120/79 | HR 93 | Ht 77.0 in | Wt 252.0 lb

## 2012-07-08 DIAGNOSIS — R002 Palpitations: Secondary | ICD-10-CM

## 2012-07-08 DIAGNOSIS — I456 Pre-excitation syndrome: Secondary | ICD-10-CM

## 2012-07-08 NOTE — Assessment & Plan Note (Signed)
His ECG demonstrates no evidence of ventricular preexcitation, status post catheter ablation. He will call us back on an as-needed basis.

## 2012-07-08 NOTE — Assessment & Plan Note (Signed)
These have essentially resolved status post ablation. I have asked him to moderate caffeine intake and avoid any recreational drugs.

## 2012-07-08 NOTE — Patient Instructions (Signed)
Your physician recommends that you schedule a follow-up appointment as needed  

## 2012-07-08 NOTE — Progress Notes (Signed)
HPI [name] returns today for followup.  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 His accessory pathway had only antegrade conduction but was able to conduct from atrium and ventricle very fast. The patient has had rare palpitation since his ablation. The soreness in his neck and leg are resolved. No syncope. No peripheral edema. No Known Allergies   Current Outpatient Prescriptions  Medication Sig Dispense Refill  . albuterol (PROVENTIL HFA;VENTOLIN HFA) 108 (90 BASE) MCG/ACT inhaler Inhale 2 puffs into the lungs every 6 (six) hours as needed for wheezing.      . cetirizine (ZYRTEC) 10 MG tablet Take 10 mg by mouth daily.      Marland Kitchen loratadine (CLARITIN) 10 MG tablet Take 10 mg by mouth daily.       No current facility-administered medications for this visit.     Past Medical History  Diagnosis Date  . Asthma   . WPW (Wolff-Parkinson-White syndrome)     a. ablation of R lateral accessory pathway 06/27/12.    ROS:   All systems reviewed and negative except as noted in the HPI.   Past Surgical History  Procedure Laterality Date  . Supraventricular tachycardia ablation  06/27/2012     No family history on file.   History   Social History  . Marital Status: Single    Spouse Name: N/A    Number of Children: N/A  . Years of Education: N/A   Occupational History  . Not on file.   Social History Main Topics  . Smoking status: Never Smoker   . Smokeless tobacco: Former Neurosurgeon    Quit date: 04/28/2012     Comment: hookah pipe daily  . Alcohol Use: 0.0 oz/week    1-2 Cans of beer per week     Comment: 1-2 times a week   . Drug Use: No  . Sexually Active: Not on file   Other Topics Concern  . Not on file   Social History Narrative  . No narrative on file     BP 120/79  Pulse 93  Ht 6\' 5"  (1.956 m)  Wt 252 lb (114.306 kg)  BMI 29.88 kg/m2  Physical  Exam:  Well appearing 22 year old young man,NAD HEENT: Unremarkable Neck:  6 cm JVD, no thyromegally Back:  No CVA tenderness Lungs:  Clear with no wheezes, rales, or rhonchi. HEART:  Regular rate rhythm, no murmurs, no rubs, no clicks Abd:  soft, positive bowel sounds, no organomegally, no rebound, no guarding Ext:  2 plus pulses, no edema, no cyanosis, no clubbing Skin:  No rashes no nodules Neuro:  CN II through XII intact, motor grossly intact  EKG - normal sinus rhythm with no ventricular preexcitation.   Assess/Plan:

## 2012-09-04 ENCOUNTER — Ambulatory Visit: Payer: BC Managed Care – PPO | Admitting: Internal Medicine

## 2012-09-09 ENCOUNTER — Encounter: Payer: Self-pay | Admitting: Internal Medicine

## 2012-12-04 ENCOUNTER — Other Ambulatory Visit: Payer: Self-pay

## 2013-03-19 ENCOUNTER — Encounter: Payer: Self-pay | Admitting: Internal Medicine

## 2013-03-19 ENCOUNTER — Ambulatory Visit (INDEPENDENT_AMBULATORY_CARE_PROVIDER_SITE_OTHER): Payer: PRIVATE HEALTH INSURANCE | Admitting: Internal Medicine

## 2013-03-19 VITALS — BP 110/82 | Ht 77.0 in | Wt 245.0 lb

## 2013-03-19 DIAGNOSIS — I456 Pre-excitation syndrome: Secondary | ICD-10-CM

## 2013-03-19 DIAGNOSIS — Z0001 Encounter for general adult medical examination with abnormal findings: Secondary | ICD-10-CM

## 2013-03-19 DIAGNOSIS — R6889 Other general symptoms and signs: Secondary | ICD-10-CM

## 2013-03-19 DIAGNOSIS — R079 Chest pain, unspecified: Secondary | ICD-10-CM

## 2013-03-19 DIAGNOSIS — R002 Palpitations: Secondary | ICD-10-CM

## 2013-03-19 NOTE — Patient Instructions (Signed)
Your physician recommends that you schedule a follow-up appointment in: 2 months with Dr Ladona Ridgelaylor  Your physician has requested that you have an echocardiogram. Echocardiography is a painless test that uses sound waves to create images of your heart. It provides your doctor with information about the size and shape of your heart and how well your heart's chambers and valves are working. This procedure takes approximately one hour. There are no restrictions for this procedure.

## 2013-03-19 NOTE — Assessment & Plan Note (Signed)
He has a grade 2/6 systolic murmur. I have asked the patient to undergo 2D echo.

## 2013-03-19 NOTE — Assessment & Plan Note (Signed)
He has no evidence of ventricular pre-excitation and has had no symptomatic SVT. He will undergo watchful waiting.

## 2013-03-19 NOTE — Progress Notes (Signed)
      HPI [name] returns today for followup. He is a pleasant 23 yo man with a h/o SVT and WPW syndrome, s/p EPS/RFA of a manifest right lateral AP several months ago. In the interim he has been stable. He notes occaisional episodes of chest pressure, mostly epigastric in location. He also has had some discomfort while eating. No syncope or recurrent SVT. No Known Allergies   Current Outpatient Prescriptions  Medication Sig Dispense Refill  . albuterol (PROVENTIL HFA;VENTOLIN HFA) 108 (90 BASE) MCG/ACT inhaler Inhale 2 puffs into the lungs every 6 (six) hours as needed for wheezing.       No current facility-administered medications for this visit.     Past Medical History  Diagnosis Date  . Asthma   . WPW (Wolff-Parkinson-White syndrome)     a. ablation of R lateral accessory pathway 06/27/12.  Marland Kitchen. Palpitations   . Ventricular pre-excitation     ROS:   All systems reviewed and negative except as noted in the HPI.   Past Surgical History  Procedure Laterality Date  . Supraventricular tachycardia ablation  06/27/2012     No family history on file.   History   Social History  . Marital Status: Single    Spouse Name: N/A    Number of Children: N/A  . Years of Education: N/A   Occupational History  . Not on file.   Social History Main Topics  . Smoking status: Never Smoker   . Smokeless tobacco: Former NeurosurgeonUser    Quit date: 04/28/2012     Comment: hookah pipe sometimes  . Alcohol Use: 0.0 oz/week    1-2 Cans of beer per week     Comment: 1-2 times a week   . Drug Use: No  . Sexual Activity: Not on file   Other Topics Concern  . Not on file   Social History Narrative  . No narrative on file     BP 110/82  Ht 6\' 5"  (1.956 m)  Wt 245 lb (111.131 kg)  BMI 29.05 kg/m2  Physical Exam:  Well appearing young man, NAD HEENT: Unremarkable Neck:  No JVD, no thyromegally Back:  No CVA tenderness Lungs:  Clear with no wheezes HEART:  Regular rate rhythm,  grade 2/6 systolic murmurs, no rubs, no clicks Abd:  soft, positive bowel sounds, no organomegally, no rebound, no guarding Ext:  2 plus pulses, no edema, no cyanosis, no clubbing Skin:  No rashes no nodules Neuro:  CN II through XII intact, motor grossly intact  EKG - nsr with no pre-excitation   Assess/Plan:

## 2013-03-20 ENCOUNTER — Ambulatory Visit (HOSPITAL_COMMUNITY): Payer: PRIVATE HEALTH INSURANCE | Attending: Internal Medicine | Admitting: Radiology

## 2013-03-20 DIAGNOSIS — J45909 Unspecified asthma, uncomplicated: Secondary | ICD-10-CM | POA: Insufficient documentation

## 2013-03-20 DIAGNOSIS — I079 Rheumatic tricuspid valve disease, unspecified: Secondary | ICD-10-CM | POA: Insufficient documentation

## 2013-03-20 DIAGNOSIS — R011 Cardiac murmur, unspecified: Secondary | ICD-10-CM

## 2013-03-20 DIAGNOSIS — R079 Chest pain, unspecified: Secondary | ICD-10-CM | POA: Insufficient documentation

## 2013-03-20 DIAGNOSIS — I456 Pre-excitation syndrome: Secondary | ICD-10-CM | POA: Insufficient documentation

## 2013-03-20 DIAGNOSIS — I498 Other specified cardiac arrhythmias: Secondary | ICD-10-CM | POA: Insufficient documentation

## 2013-03-20 NOTE — Progress Notes (Signed)
Echocardiogram performed.  

## 2013-05-14 ENCOUNTER — Emergency Department (INDEPENDENT_AMBULATORY_CARE_PROVIDER_SITE_OTHER)
Admission: EM | Admit: 2013-05-14 | Discharge: 2013-05-14 | Disposition: A | Discharge status: Home / Self Care | Payer: PRIVATE HEALTH INSURANCE | Source: Home / Self Care

## 2013-05-14 ENCOUNTER — Encounter (HOSPITAL_COMMUNITY): Payer: Self-pay | Admitting: Emergency Medicine

## 2013-05-14 DIAGNOSIS — J029 Acute pharyngitis, unspecified: Secondary | ICD-10-CM

## 2013-05-14 LAB — POCT INFECTIOUS MONO SCREEN: MONO SCREEN: NEGATIVE

## 2013-05-14 LAB — POCT RAPID STREP A: Streptococcus, Group A Screen (Direct): NEGATIVE

## 2013-05-14 NOTE — ED Provider Notes (Signed)
CSN: [id number]     Arrival date & time 05/14/13  1336 History   First MD Initiated Contact with Patient 05/14/13 1451     Chief Complaint  Patient presents with  . Sore Throat   (Consider location/radiation/quality/duration/timing/severity/associated sxs/prior Treatment) HPI Comments: Sore throat for 24 h. No fever.   Past Medical History  Diagnosis Date  . Asthma   . WPW (Wolff-Parkinson-White syndrome)     a. ablation of R lateral accessory pathway 06/27/12.  Marland Kitchen. Palpitations   . Ventricular pre-excitation    Past Surgical History  Procedure Laterality Date  . Supraventricular tachycardia ablation  06/27/2012   History reviewed. No pertinent family history. History  Substance Use Topics  . Smoking status: Never Smoker   . Smokeless tobacco: Former NeurosurgeonUser    Quit date: 04/28/2012     Comment: hookah pipe sometimes  . Alcohol Use: 0.0 oz/week    1-2 Cans of beer per week     Comment: 1-2 times a week     Review of Systems  Constitutional: Positive for fever. Negative for activity change and fatigue.  HENT: Positive for sore throat. Negative for congestion, ear pain and sinus pressure.   Eyes: Negative.   Respiratory: Negative for cough, shortness of breath and stridor.   Cardiovascular: Negative.   Skin: Negative for rash.  Neurological: Negative.     Allergies  Review of patient's allergies indicates no known allergies.  Home Medications   Prior to Admission medications   Medication Sig Start Date End Date Taking? Authorizing Provider  albuterol (PROVENTIL HFA;VENTOLIN HFA) 108 (90 BASE) MCG/ACT inhaler Inhale 2 puffs into the lungs every 6 (six) hours as needed for wheezing.    Historical Provider, MD   BP 127/77  Pulse 94  Temp(Src) 99.2 F (37.3 C) (Oral)  Resp 14  SpO2 98% Physical Exam  Nursing note and vitals reviewed. Constitutional: He is oriented to person, place, and time. He appears well-developed and well-nourished. No distress.  HENT:   Mouth/Throat: No oropharyngeal exudate.  Blat TM's nl OP with streaky erythema. No exudates  Eyes: Conjunctivae and EOM are normal.  Neck: Normal range of motion. Neck supple.  Cardiovascular: Normal rate, regular rhythm and normal heart sounds.   Pulmonary/Chest: Effort normal and breath sounds normal. No respiratory distress. He has no wheezes. He has no rales.  Lymphadenopathy:    He has no cervical adenopathy.  Neurological: He is alert and oriented to person, place, and time.  Skin: Skin is warm and dry. No rash noted.    ED Course  Procedures (including critical care time) Labs Review Labs Reviewed  CULTURE, GROUP A STREP  POCT RAPID STREP A ([facility] ONLY)  POCT INFECTIOUS MONO SCREEN    Results for orders placed during the hospital encounter of 05/14/13  POCT RAPID STREP A ([facility] ONLY)      Result Value Ref Range   Streptococcus, Group A Screen (Direct) NEGATIVE  NEGATIVE  POCT INFECTIOUS MONO SCREEN      Result Value Ref Range   Mono Screen NEGATIVE  NEGATIVE   Imaging Review No results found.   MDM   1. Pharyngitis    Fluids Ibuprofen or tylenol cepacol loz. Cult pending     Hayden Rasmussenavid Abria Vannostrand, NP 05/14/13 [phone]

## 2013-05-14 NOTE — Discharge Instructions (Signed)
Pharyngitis Pharyngitis is redness, pain, and swelling (inflammation) of your pharynx.  CAUSES  Pharyngitis is usually caused by infection. Most of the time, these infections are from viruses (viral) and are part of a cold. However, sometimes pharyngitis is caused by bacteria (bacterial). Pharyngitis can also be caused by allergies. Viral pharyngitis may be spread from person to person by coughing, sneezing, and personal items or utensils (cups, forks, spoons, toothbrushes). Bacterial pharyngitis may be spread from person to person by more intimate contact, such as kissing.  SIGNS AND SYMPTOMS  Symptoms of pharyngitis include:   Sore throat.   Tiredness (fatigue).   Low-grade fever.   Headache.  Joint pain and muscle aches.  Skin rashes.  Swollen lymph nodes.  Plaque-like film on throat or tonsils (often seen with bacterial pharyngitis). DIAGNOSIS  Your health care provider will ask you questions about your illness and your symptoms. Your medical history, along with a physical exam, is often all that is needed to diagnose pharyngitis. Sometimes, a rapid strep test is done. Other lab tests may also be done, depending on the suspected cause.  TREATMENT  Viral pharyngitis will usually get better in 3 4 days without the use of medicine. Bacterial pharyngitis is treated with medicines that kill germs (antibiotics).  HOME CARE INSTRUCTIONS   Drink enough water and fluids to keep your urine clear or pale yellow.   Only take over-the-counter or prescription medicines as directed by your health care provider:   If you are prescribed antibiotics, make sure you finish them even if you start to feel better.   Do not take aspirin.   Get lots of rest.   Gargle with 8 oz of salt water ( tsp of salt per 1 qt of water) as often as every 1 2 hours to soothe your throat.   Throat lozenges (if you are not at risk for choking) or sprays may be used to soothe your throat. SEEK MEDICAL  CARE IF:   You have large, tender lumps in your neck.  You have a rash.  You cough up green, yellow-brown, or bloody spit. SEEK IMMEDIATE MEDICAL CARE IF:   Your neck becomes stiff.  You drool or are unable to swallow liquids.  You vomit or are unable to keep medicines or liquids down.  You have severe pain that does not go away with the use of recommended medicines.  You have trouble breathing (not caused by a stuffy nose). MAKE SURE YOU:   Understand these instructions.  Will watch your condition.  Will get help right away if you are not doing well or get worse. Document Released: 01/15/2005 Document Revised: 11/05/2012 Document Reviewed: 09/22/2012 [facility] Patient Information 2014 [place].  Salt Water Gargle This solution will help make your mouth and throat feel better. HOME CARE INSTRUCTIONS   Mix 1 teaspoon of salt in 8 ounces of warm water.  Gargle with this solution as much or often as you need or as directed. Swish and gargle gently if you have any sores or wounds in your mouth.  Do not swallow this mixture. Document Released: 10/20/2003 Document Revised: 04/09/2011 Document Reviewed: 03/12/2008 [facility] Patient Information 2014 [place].  Sore Throat A sore throat is pain, burning, irritation, or scratchiness of the throat. There is often pain or tenderness when swallowing or talking. A sore throat may be accompanied by other symptoms, such as coughing, sneezing, fever, and swollen neck glands. A sore throat is often the first sign of another sickness, such as  a cold, flu, strep throat, or mononucleosis (commonly known as mono). Most sore throats go away without medical treatment. CAUSES  The most common causes of a sore throat include:  A viral infection, such as a cold, flu, or mono.  A bacterial infection, such as strep throat, tonsillitis, or whooping cough.  Seasonal allergies.  Dryness in the air.  Irritants, such as smoke or  pollution.  Gastroesophageal reflux disease (GERD). HOME CARE INSTRUCTIONS   Only take over-the-counter medicines as directed by your caregiver.  Drink enough fluids to keep your urine clear or pale yellow.  Rest as needed.  Try using throat sprays, lozenges, or sucking on hard candy to ease any pain (if older than 4 years or as directed).  Sip warm liquids, such as broth, herbal tea, or warm water with honey to relieve pain temporarily. You may also eat or drink cold or frozen liquids such as frozen ice pops.  Gargle with salt water (mix 1 tsp salt with 8 oz of water).  Do not smoke and avoid secondhand smoke.  Put a cool-mist humidifier in your bedroom at night to moisten the air. You can also turn on a hot shower and sit in the bathroom with the door closed for 5 10 minutes. SEEK IMMEDIATE MEDICAL CARE IF:  You have difficulty breathing.  You are unable to swallow fluids, soft foods, or your saliva.  You have increased swelling in the throat.  Your sore throat does not get better in 7 days.  You have nausea and vomiting.  You have a fever or persistent symptoms for more than 2 3 days.  You have a fever and your symptoms suddenly get worse. MAKE SURE YOU:   Understand these instructions.  Will watch your condition.  Will get help right away if you are not doing well or get worse. Document Released: 02/23/2004 Document Revised: 01/02/2012 Document Reviewed: 09/23/2011 [facility] Patient Information 2014 [place].  Viral Pharyngitis Viral pharyngitis is a viral infection that produces redness, pain, and swelling (inflammation) of the throat. It can spread from person to person (contagious). CAUSES Viral pharyngitis is caused by inhaling a large amount of certain germs called viruses. Many different viruses cause viral pharyngitis. SYMPTOMS Symptoms of viral pharyngitis include:  Sore throat.  Tiredness.  Stuffy nose.  Low-grade  fever.  Congestion.  Cough. TREATMENT Treatment includes rest, drinking plenty of fluids, and the use of over-the-counter medication (approved by your caregiver). HOME CARE INSTRUCTIONS   Drink enough fluids to keep your urine clear or pale yellow.  Eat soft, cold foods such as ice cream, frozen ice pops, or gelatin dessert.  Gargle with warm salt water (1 tsp salt per 1 qt of water).  If over age 97, throat lozenges may be used safely.  Only take over-the-counter or prescription medicines for pain, discomfort, or fever as directed by your caregiver. Do not take aspirin. To help prevent spreading viral pharyngitis to others, avoid:  Mouth-to-mouth contact with others.  Sharing utensils for eating and drinking.  Coughing around others. SEEK MEDICAL CARE IF:   You are better in a few days, then become worse.  You have a fever or pain not helped by pain medicines.  There are any other changes that concern you. Document Released: 10/25/2004 Document Revised: 04/09/2011 Document Reviewed: 03/23/2010 [facility] Patient Information 2014 [place], [place].

## 2013-05-14 NOTE — ED Notes (Signed)
Pt  Reports   Symptoms  Of  sorethroat  That  Began  Yesterday        Pt  Is  Sitting  Upright on  Exam table  Speaking in  Complete  sentances      Pt  Wants  To be  Checked  For Mono

## 2013-05-15 NOTE — ED Provider Notes (Signed)
Medical screening examination/treatment/procedure(s) were performed by a resident physician or non-physician practitioner and as the supervising physician I was immediately available for consultation/collaboration.  Evan Corey, MD    Evan S Corey, MD 05/15/13 0805 

## 2013-05-16 LAB — CULTURE, GROUP A STREP

## 2013-05-18 ENCOUNTER — Encounter (HOSPITAL_COMMUNITY): Payer: Self-pay | Admitting: Emergency Medicine

## 2013-05-18 ENCOUNTER — Emergency Department (INDEPENDENT_AMBULATORY_CARE_PROVIDER_SITE_OTHER)
Admission: EM | Admit: 2013-05-18 | Discharge: 2013-05-18 | Disposition: A | Discharge status: Home / Self Care | Payer: PRIVATE HEALTH INSURANCE | Source: Home / Self Care | Attending: Family Medicine | Admitting: Family Medicine

## 2013-05-18 DIAGNOSIS — J329 Chronic sinusitis, unspecified: Secondary | ICD-10-CM

## 2013-05-18 DIAGNOSIS — J029 Acute pharyngitis, unspecified: Secondary | ICD-10-CM

## 2013-05-18 DIAGNOSIS — R0982 Postnasal drip: Secondary | ICD-10-CM

## 2013-05-18 MED ORDER — IPRATROPIUM BROMIDE 0.06 % NA SOLN: 2.0000 | Freq: Four times a day (QID) | NASAL | Status: DC

## 2013-05-18 MED ORDER — LORATADINE 10 MG PO TABS: 10.0000 mg | ORAL_TABLET | Freq: Every day | ORAL | Status: DC

## 2013-05-18 NOTE — Discharge Instructions (Signed)
Sore Throat A sore throat is pain, burning, irritation, or scratchiness of the throat. There is often pain or tenderness when swallowing or talking. A sore throat may be accompanied by other symptoms, such as coughing, sneezing, fever, and swollen neck glands. A sore throat is often the first sign of another sickness, such as a cold, flu, strep throat, or mononucleosis (commonly known as mono). Most sore throats go away without medical treatment. CAUSES  The most common causes of a sore throat include:  A viral infection, such as a cold, flu, or mono.  A bacterial infection, such as strep throat, tonsillitis, or whooping cough.  Seasonal allergies.  Dryness in the air.  Irritants, such as smoke or pollution.  Gastroesophageal reflux disease (GERD). HOME CARE INSTRUCTIONS   Only take over-the-counter medicines as directed by your caregiver.  Drink enough fluids to keep your urine clear or pale yellow.  Rest as needed.  Try using throat sprays, lozenges, or sucking on hard candy to ease any pain (if older than 4 years or as directed).  Sip warm liquids, such as broth, herbal tea, or warm water with honey to relieve pain temporarily. You may also eat or drink cold or frozen liquids such as frozen ice pops.  Gargle with salt water (mix 1 tsp salt with 8 oz of water).  Do not smoke and avoid secondhand smoke.  Put a cool-mist humidifier in your bedroom at night to moisten the air. You can also turn on a hot shower and sit in the bathroom with the door closed for 5 10 minutes. SEEK IMMEDIATE MEDICAL CARE IF:  You have difficulty breathing.  You are unable to swallow fluids, soft foods, or your saliva.  You have increased swelling in the throat.  Your sore throat does not get better in 7 days.  You have nausea and vomiting.  You have a fever or persistent symptoms for more than 2 3 days.  You have a fever and your symptoms suddenly get worse. MAKE SURE YOU:   Understand  these instructions.  Will watch your condition.  Will get help right away if you are not doing well or get worse. Document Released: 02/23/2004 Document Revised: 01/02/2012 Document Reviewed: 09/23/2011 [employer] Patient Information 2014 [employer].  

## 2013-05-18 NOTE — ED Provider Notes (Signed)
CSN: [id number]     Arrival date & time 05/18/13  1057 History   First MD Initiated Contact with Patient 05/18/13 1132     Chief Complaint  Patient presents with  . Sore Throat   (Consider location/radiation/quality/duration/timing/severity/associated sxs/prior Treatment) HPI Comments: Patient states he was seen for same on 05-14-2013 and while he is feeling somewhat better, his symptoms have not completely resolved. Reports he has had a great deal of post nasal drainage and feels that this may be contributing to throat irritation. No fever or additional URI sx. No difficulty breathing, speaking or swallowing.    Lab reports reviewed from 05-14-2013 visit and monospot was negative, rapid strep study was negative and subsequent throat culture was also negative.  Patient is a 23 y.o. male presenting with pharyngitis. The history is provided by the patient.  Sore Throat Chronicity: began 05-14-2013.    Past Medical History  Diagnosis Date  . Asthma   . WPW (Wolff-Parkinson-White syndrome)     a. ablation of R lateral accessory pathway 06/27/12.  Marland Kitchen. Palpitations   . Ventricular pre-excitation    Past Surgical History  Procedure Laterality Date  . Supraventricular tachycardia ablation  06/27/2012   History reviewed. No pertinent family history. History  Substance Use Topics  . Smoking status: Never Smoker   . Smokeless tobacco: Former NeurosurgeonUser    Quit date: 04/28/2012     Comment: hookah pipe sometimes  . Alcohol Use: 0.0 oz/week    1-2 Cans of beer per week     Comment: 1-2 times a week     Review of Systems  All other systems reviewed and are negative.   Allergies  Review of patient's allergies indicates no known allergies.  Home Medications   Prior to Admission medications   Medication Sig Start Date End Date Taking? Authorizing Provider  albuterol (PROVENTIL HFA;VENTOLIN HFA) 108 (90 BASE) MCG/ACT inhaler Inhale 2 puffs into the lungs every 6 (six) hours as needed for  wheezing.    Historical Provider, MD   BP 131/75  Pulse 90  Temp(Src) 99 F (37.2 C) (Oral)  Resp 14  SpO2 98% Physical Exam  Nursing note and vitals reviewed. Constitutional: He is oriented to person, place, and time. He appears well-developed and well-nourished. No distress.  HENT:  Head: Normocephalic and atraumatic.  Right Ear: Hearing, tympanic membrane, external ear and ear canal normal.  Left Ear: Hearing, tympanic membrane, external ear and ear canal normal.  Nose: Nose normal.  Mouth/Throat: Uvula is midline, oropharynx is clear and moist and mucous membranes are normal.  Eyes: Conjunctivae are normal.  Neck: Normal range of motion. Neck supple. No thyromegaly present.  Cardiovascular: Normal rate, regular rhythm and normal heart sounds.   Pulmonary/Chest: Effort normal and breath sounds normal.  Musculoskeletal: Normal range of motion.  Lymphadenopathy:    He has no cervical adenopathy.  Neurological: He is alert and oriented to person, place, and time.  Skin: Skin is warm and dry. No rash noted.  Psychiatric: He has a normal mood and affect. His behavior is normal.    ED Course  Procedures (including critical care time) Labs Review Labs Reviewed - No data to display  Results for orders placed during the hospital encounter of 05/14/13  CULTURE, GROUP A STREP      Result Value Ref Range   Specimen Description THROAT     Special Requests NONE     Culture       Value: No Beta Hemolytic Streptococci Isolated  [facility]   Report Status 05/16/2013 FINAL    POCT RAPID STREP A ([facility] ONLY)      Result Value Ref Range   Streptococcus, Group A Screen (Direct) NEGATIVE  NEGATIVE  POCT INFECTIOUS MONO SCREEN      Result Value Ref Range   Mono Screen NEGATIVE  NEGATIVE   Imaging Review No results found.   MDM   1. Post-nasal drainage   2. Sore throat   Will attempt to help patient with his symptoms using Claritin and Atrovent  nasal spray and advise PCP follow up if no improvement. Provided patient with copy of lab reports from 05-14-2013 visit.    Jess BartersJennifer Lee Del CityPresson, GeorgiaPA 05/18/13 1302

## 2013-05-18 NOTE — ED Notes (Signed)
Pt   Has  Symptoms  Of  sorethroat  And  Pain  When  He  Swallows         He  Reports      He  Was  Seen  ucc   A  Few  Days  Ago  And  Continues  To have  Symptoms      -  He  Is  Sitting upright on  The  Exam table  Speaking in  Complete  sentances  And  Is  In no  Acute  Distress

## 2013-05-19 ENCOUNTER — Ambulatory Visit: Payer: PRIVATE HEALTH INSURANCE | Admitting: Internal Medicine

## 2013-05-20 NOTE — ED Provider Notes (Signed)
Medical screening examination/treatment/procedure(s) were performed by a resident physician or non-physician practitioner and as the supervising physician I was immediately available for consultation/collaboration.  Maeson Purohit, MD    Bassel Gaskill S Nygeria Lager, MD 05/20/13 1324 

## 2013-06-09 ENCOUNTER — Other Ambulatory Visit (HOSPITAL_COMMUNITY): Payer: Self-pay

## 2013-06-09 DIAGNOSIS — J45901 Unspecified asthma with (acute) exacerbation: Secondary | ICD-10-CM

## 2013-06-11 ENCOUNTER — Encounter: Payer: Self-pay | Admitting: Internal Medicine

## 2013-06-11 ENCOUNTER — Ambulatory Visit (INDEPENDENT_AMBULATORY_CARE_PROVIDER_SITE_OTHER): Payer: PRIVATE HEALTH INSURANCE | Admitting: Internal Medicine

## 2013-06-11 VITALS — BP 128/74 | HR 93 | Ht 77.0 in | Wt 252.0 lb

## 2013-06-11 DIAGNOSIS — I456 Pre-excitation syndrome: Secondary | ICD-10-CM

## 2013-06-11 NOTE — Progress Notes (Signed)
      HPI [name] returns today for followup. He is a pleasant 23 yo man with a h/o WPW syndrome, s/p catheter ablation of a right sided  AP one year ago. In the interim, he has had chest pain and sob and underwent 2D echo which demonstrated normal LV function. Since then, he has done well and is considering joining the [employer]. He would like medical clearance. No Known Allergies   No current outpatient prescriptions on file.   No current facility-administered medications for this visit.     Past Medical History  Diagnosis Date  . Asthma   . WPW (Wolff-Parkinson-White syndrome)     a. ablation of R lateral accessory pathway 06/27/12.  Marland Kitchen. Palpitations   . Ventricular pre-excitation     ROS:   All systems reviewed and negative except as noted in the HPI.   Past Surgical History  Procedure Laterality Date  . Supraventricular tachycardia ablation  06/27/2012     No family history on file.   History   Social History  . Marital Status: Single    Spouse Name: N/A    Number of Children: N/A  . Years of Education: N/A   Occupational History  . Not on file.   Social History Main Topics  . Smoking status: Never Smoker   . Smokeless tobacco: Former NeurosurgeonUser    Quit date: 04/28/2012     Comment: hookah pipe sometimes  . Alcohol Use: 0.0 oz/week    1-2 Cans of beer per week     Comment: 1-2 times a week   . Drug Use: No  . Sexual Activity: Not on file   Other Topics Concern  . Not on file   Social History Narrative  . No narrative on file     Ht 6\' 5"  (1.956 m)  Wt 252 lb (114.306 kg)  BMI 29.88 kg/m2  Physical Exam:  Well appearing 23 yo man, NAD HEENT: Unremarkable Neck:  No JVD, no thyromegally Back:  No CVA tenderness Lungs:  Clear with no wheezes HEART:  Regular rate rhythm, no murmurs, no rubs, no clicks Abd:  soft, positive bowel sounds, no organomegally, no rebound, no guarding Ext:  2 plus pulses, no edema, no cyanosis, no clubbing Skin:  No  rashes no nodules Neuro:  CN II through XII intact, motor grossly intact  EKG - nsr with no ventricular pre-excitation.  Assess/Plan:

## 2013-06-11 NOTE — Patient Instructions (Signed)
Your physician recommends that you schedule a follow-up appointment as needed  

## 2013-06-11 NOTE — Assessment & Plan Note (Signed)
He has no additional evidence of WPW syndrome, s/p catheter ablation one year ago. In addition, he has normal LV function by echo with no valvular abnormalities.

## 2013-06-12 ENCOUNTER — Telehealth: Payer: Self-pay | Admitting: Internal Medicine

## 2013-06-12 NOTE — Telephone Encounter (Signed)
Note Faxed To [employer]

## 2013-06-15 ENCOUNTER — Ambulatory Visit (HOSPITAL_COMMUNITY)
Admission: RE | Admit: 2013-06-15 | Discharge: 2013-06-15 | Disposition: A | Discharge status: Home / Self Care | Payer: PRIVATE HEALTH INSURANCE | Source: Ambulatory Visit | Attending: Family Medicine | Admitting: Family Medicine

## 2013-06-15 DIAGNOSIS — J45909 Unspecified asthma, uncomplicated: Secondary | ICD-10-CM | POA: Insufficient documentation

## 2013-06-15 MED ORDER — ALBUTEROL SULFATE (2.5 MG/3ML) 0.083% IN NEBU
2.5000 mg | INHALATION_SOLUTION | Freq: Once | RESPIRATORY_TRACT | Status: AC
  Administered 2013-06-15: 2.5 mg via RESPIRATORY_TRACT

## 2013-06-18 LAB — PULMONARY FUNCTION TEST
DL/VA % pred: 145 %
DL/VA: 7.36 ml/min/mmHg/L
DLCO unc % pred: 152 %
DLCO unc: 63.56 ml/min/mmHg
FEF 25-75 Post: 5.47 L/sec
FEF 25-75 Pre: 5.26 L/sec
FEF2575-%CHANGE-POST: 3 %
FEF2575-%PRED-POST: 103 %
FEF2575-%Pred-Pre: 99 %
FEV1-%Change-Post: 0 %
FEV1-%PRED-POST: 111 %
FEV1-%Pred-Pre: 110 %
FEV1-POST: 5.45 L
FEV1-Pre: 5.44 L
FEV1FVC-%CHANGE-POST: 4 %
FEV1FVC-%PRED-PRE: 99 %
FEV6-%Change-Post: -4 %
FEV6-%PRED-PRE: 111 %
FEV6-%Pred-Post: 106 %
FEV6-Post: 6.16 L
FEV6-Pre: 6.44 L
FEV6FVC-%PRED-POST: 101 %
FEV6FVC-%Pred-Pre: 101 %
FVC-%CHANGE-POST: -4 %
FVC-%Pred-Post: 105 %
FVC-%Pred-Pre: 110 %
FVC-PRE: 6.44 L
FVC-Post: 6.16 L
PRE FEV1/FVC RATIO: 85 %
PRE FEV6/FVC RATIO: 100 %
Post FEV1/FVC ratio: 89 %
Post FEV6/FVC ratio: 100 %
RV % PRED: 160 %
RV: 2.9 L
TLC % pred: 112 %
TLC: 9.3 L

## 2013-08-05 ENCOUNTER — Ambulatory Visit: Payer: Private Health Insurance - Indemnity | Admitting: Pediatrics

## 2013-08-05 ENCOUNTER — Telehealth: Payer: Self-pay | Admitting: Internal Medicine

## 2013-08-05 DIAGNOSIS — Z0000 Encounter for general adult medical examination without abnormal findings: Secondary | ICD-10-CM

## 2013-08-05 NOTE — Telephone Encounter (Signed)
LMTCB

## 2013-08-05 NOTE — Telephone Encounter (Signed)
New message    Patient calling regarding  same letter Dr. Ladona Ridgelaylor  wrote for him to be accepted  into [employer] .  Patient is asking for same letter does not have that diagnosis anymore - Exclude out the [employer] or anything to do with [employer].

## 2013-08-06 NOTE — Telephone Encounter (Signed)
Left message to notify patient that Dr. Ladona Ridgelaylor and his primary nurse, Dennis BastKelly Lanier, RN are out of the office until next week and that I am forwarding message to them to address next week.  I advised patient to call back with questions or concerns.

## 2013-08-11 ENCOUNTER — Encounter: Payer: Self-pay | Admitting: *Deleted

## 2013-08-11 NOTE — Telephone Encounter (Signed)
Left message to call back and let me know if he wants the letter mailed or he can come pick it up

## 2013-08-12 ENCOUNTER — Telehealth: Payer: Self-pay | Admitting: Internal Medicine

## 2013-08-12 NOTE — Telephone Encounter (Signed)
New message     Please mail letter to his home address.  Someone called and left a message as to what to do with the letter.

## 2013-08-17 ENCOUNTER — Telehealth: Payer: Self-pay | Admitting: Internal Medicine

## 2013-08-17 NOTE — Telephone Encounter (Signed)
New message    Patient wants the nurse to know he will be coming by the office to pick up the letter. WPW

## 2013-08-17 NOTE — Telephone Encounter (Signed)
Mailed the first letter

## 2013-08-18 ENCOUNTER — Telehealth: Payer: Self-pay | Admitting: Cardiology

## 2013-08-18 NOTE — Telephone Encounter (Signed)
Follow up:      Following up on paper work for the [employer]. Pt coming to collect it today.

## 2013-11-13 ENCOUNTER — Other Ambulatory Visit: Payer: Self-pay

## 2014-01-07 ENCOUNTER — Encounter (HOSPITAL_COMMUNITY): Payer: Self-pay | Admitting: Internal Medicine

## 2014-03-12 ENCOUNTER — Institutional Professional Consult (permissible substitution): Payer: Private Health Insurance - Indemnity | Admitting: Pediatrics

## 2014-03-12 DIAGNOSIS — F902 Attention-deficit hyperactivity disorder, combined type: Secondary | ICD-10-CM

## 2014-03-12 DIAGNOSIS — F8181 Disorder of written expression: Secondary | ICD-10-CM

## 2014-06-01 ENCOUNTER — Institutional Professional Consult (permissible substitution): Payer: Self-pay | Admitting: Pediatrics

## 2014-06-15 ENCOUNTER — Institutional Professional Consult (permissible substitution): Payer: Self-pay | Admitting: Pediatrics

## 2014-11-21 IMAGING — CR DG CHEST 2V
2 series · 2 of 2 positions shown · non-contrast
Comparison: 05/11/2012

CLINICAL DATA: Wolff-Parkinson-White syndrome, status post ablation

CHEST - 2 VIEW

[w chest pa]
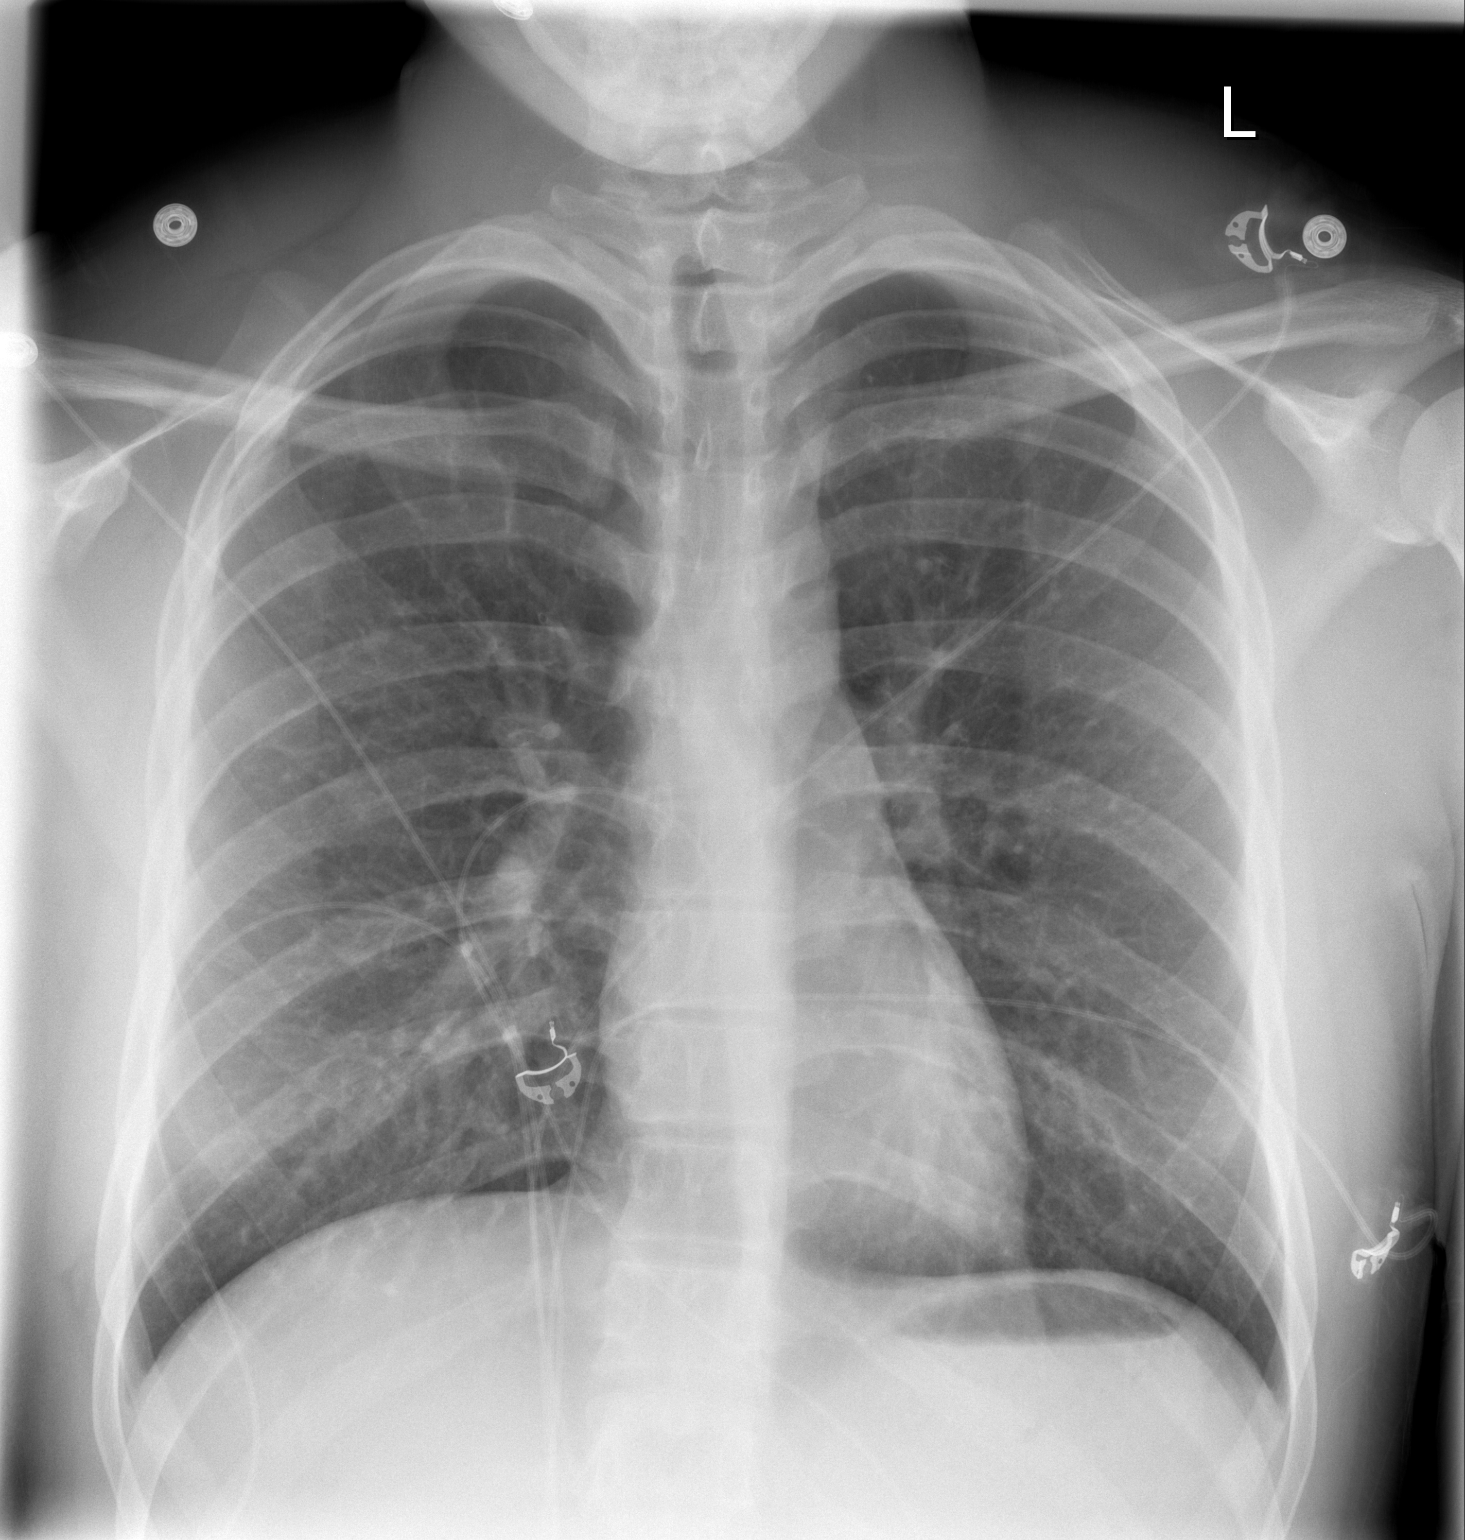

[w chest lat]
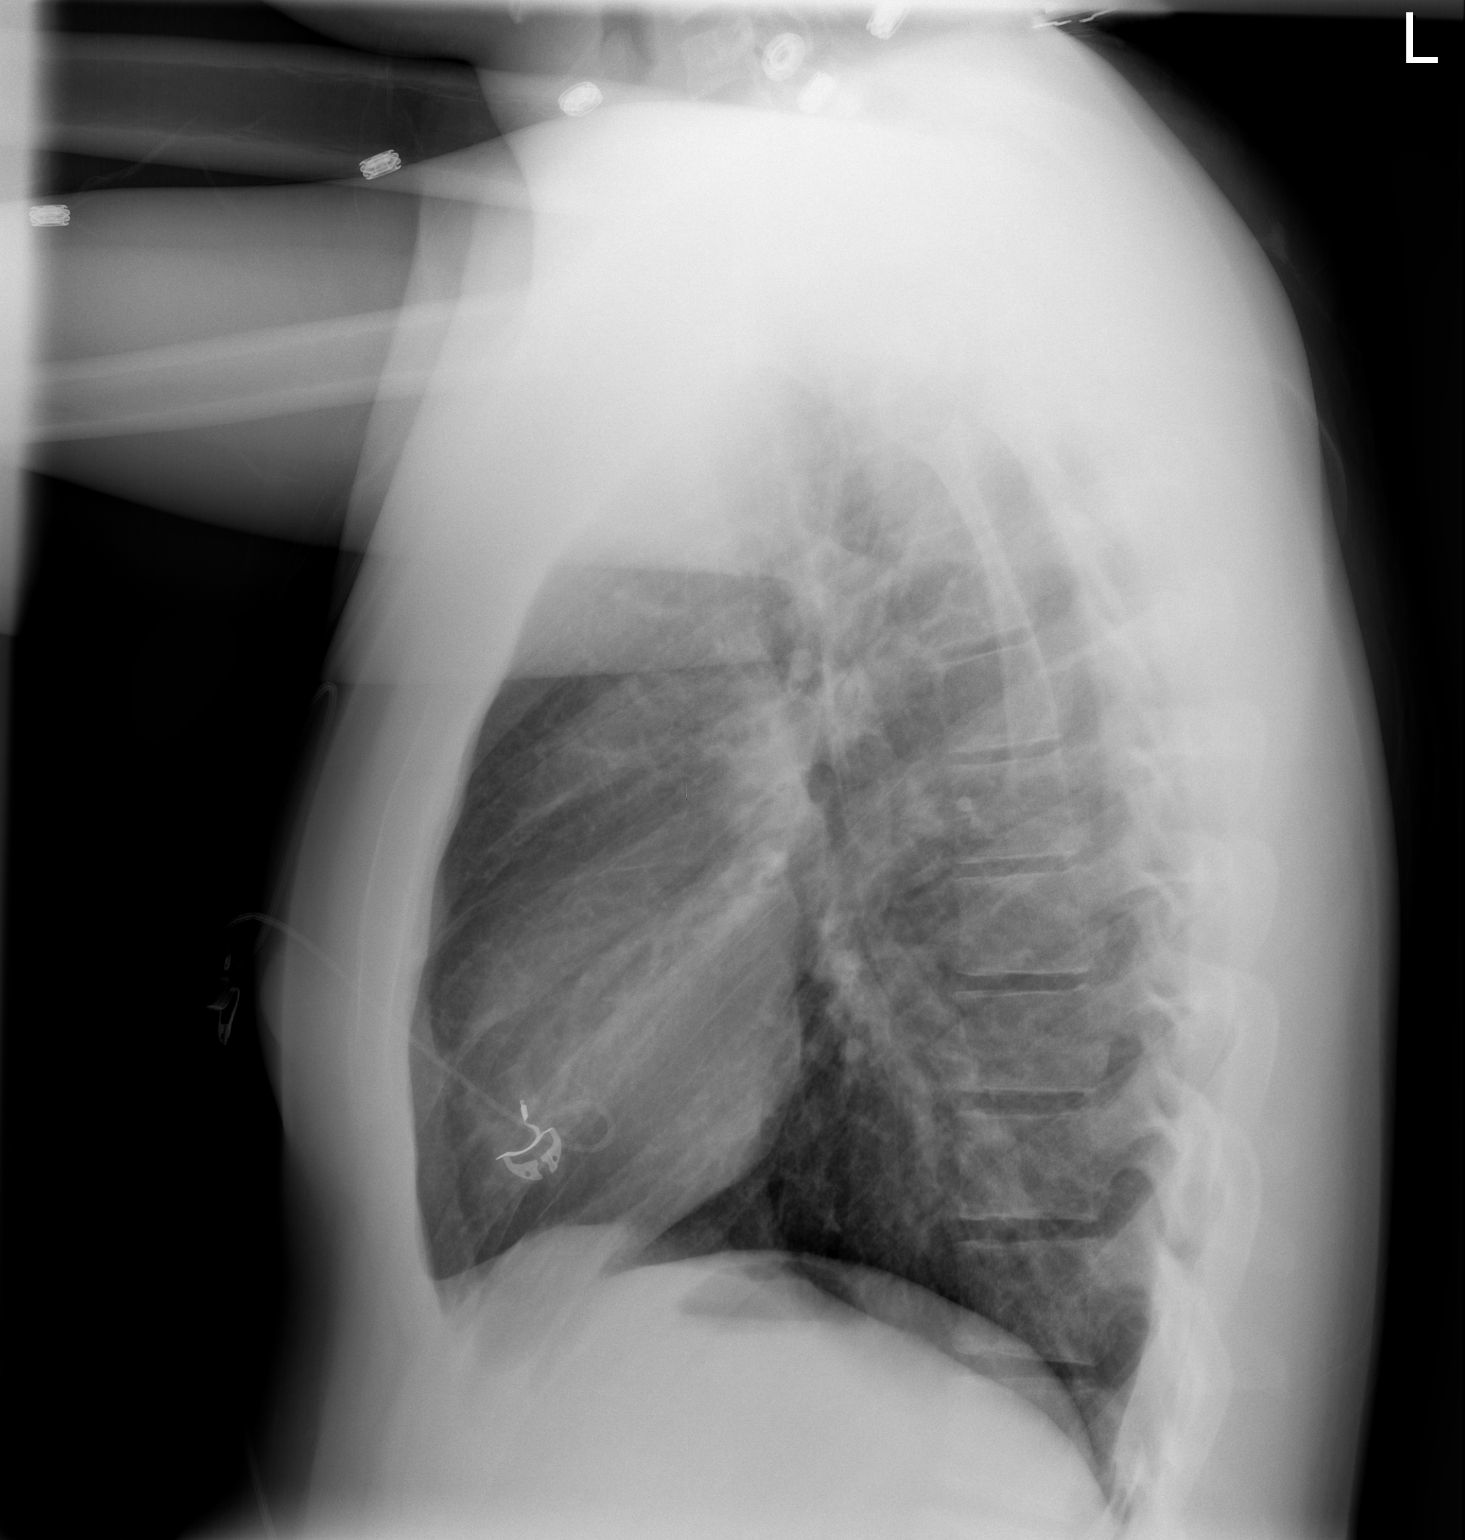

[2 of 2 positions shown; findings below may reference images not displayed]

FINDINGS: Lungs are clear. No pleural effusion or pneumothorax.

Cardiomediastinal silhouette is within normal limits.

Visualized osseous structures are within normal limits.
IMPRESSION: No evidence of acute cardiopulmonary disease.

## 2018-01-17 ENCOUNTER — Telehealth: Payer: Self-pay | Admitting: *Deleted

## 2018-01-17 NOTE — Telephone Encounter (Signed)
   [facility] Pre-operative Risk Assessment    Request for surgical clearance:  PT LAST SEEN 2015 WITH DR. Lovena Le FOR WPW. OV NOTE STATES PRN F/U   1. What type of surgery is being performed? DENTAL PROCEDURE WHICH MAY INVOLVE PROPHYLAXIS, EXTRACTIONS, RESTORATIONS, ENDODONTICS, PERIDONTICS   2. When is this surgery scheduled? TBD   3. What type of clearance is required (medical clearance vs. Pharmacy clearance to hold med vs. Both)? DENTAL OFFICE NOT ASKING FOR CLEARANCE; THEY ARE ASKING IF THE NEED FOR SBE  4. Are there any medications that need to be held prior to surgery and how long?NONE   5. Practice name and name of physician performing surgery? [facility]  6. What is your office phone number [phone]    7.   What is your office fax number [phone]  8.   Anesthesia type (None, local, MAC, general) ? MAY USE ONE OF THE FOLLOWING SEPTOCAINE, LIDOCAINE, NOVOCAINE     Julaine Hua 01/17/2018, 9:48 AM  _________________________________________________________________   (provider comments below)

## 2018-01-17 NOTE — Telephone Encounter (Signed)
   Chart reviewed. SBE prophylaxis is not needed for this pt.   I will route this recommendation to the requesting party via Epic fax function and remove from pre-op pool.  Please call with questions.  Robbie LisBrittainy Tomy Khim, PA-C 01/17/2018, 4:19 PM

## 2020-04-03 ENCOUNTER — Ambulatory Visit (HOSPITAL_COMMUNITY)
Admission: EM | Admit: 2020-04-03 | Discharge: 2020-04-03 | Disposition: A | Discharge status: Home / Self Care | Payer: Private Health Insurance - Indemnity | Attending: Emergency Medicine | Admitting: Emergency Medicine

## 2020-04-03 ENCOUNTER — Encounter (HOSPITAL_COMMUNITY): Payer: Self-pay | Admitting: *Deleted

## 2020-04-03 ENCOUNTER — Other Ambulatory Visit: Payer: Self-pay

## 2020-04-03 DIAGNOSIS — J40 Bronchitis, not specified as acute or chronic: Secondary | ICD-10-CM

## 2020-04-03 DIAGNOSIS — R059 Cough, unspecified: Secondary | ICD-10-CM

## 2020-04-03 MED ORDER — ALBUTEROL SULFATE HFA 108 (90 BASE) MCG/ACT IN AERS: 1.0000 | INHALATION_SPRAY | Freq: Four times a day (QID) | RESPIRATORY_TRACT | 0 refills | Status: DC | PRN

## 2020-04-03 MED ORDER — PREDNISONE 10 MG PO TABS: 20.0000 mg | ORAL_TABLET | Freq: Every day | ORAL | 0 refills | Status: AC

## 2020-04-03 NOTE — ED Triage Notes (Signed)
Pt reports he has ben using his inhaler for his asthmawith out relief. Pt has also been having a productive . Last Sunday he bought a vap -[place] .

## 2020-04-03 NOTE — ED Provider Notes (Signed)
[facility]   [id number] 04/03/20 Arrival Time: 1758   SUBJECTIVE:  Wayne Gomez is a 30 y.o. male who presents with complaint of nasal congestion, post-nasal drainage, and a persistent cough. Onset abrupt approximately a few days ago. Overall fatigued. SOB: mild. Wheezing: mild. Cough: mild OTC treatment: Mucinex. Social History   Tobacco Use  Smoking Status Never Smoker  Smokeless Tobacco Former Neurosurgeon  Tobacco Comment   hookah pipe sometimes    ROS: As per HPI.   OBJECTIVE:  Vitals:   04/03/20 1814  BP: (!) 152/75  Pulse: 97  Resp: 18  Temp: 99 F (37.2 C)  TempSrc: Oral  SpO2: 98%     General appearance: alert; no distress HEENT: nasal congestion; clear runny nose; throat irritation secondary to post-nasal drainage Neck: supple without LAD Lungs: clear to auscultation bilaterally, no extraneous sounds, chest expansion equal Skin: warm and dry Psychological: alert and cooperative; normal mood and affect  No results found.  No Known Allergies  Past Medical History:  Diagnosis Date  . Asthma   . Palpitations   . Ventricular pre-excitation   . WPW (Wolff-Parkinson-White syndrome)    a. ablation of R lateral accessory pathway 06/27/12.   Social History   Socioeconomic History  . Marital status: Single    Spouse name: Not on file  . Number of children: Not on file  . Years of education: Not on file  . Highest education level: Not on file  Occupational History  . Not on file  Tobacco Use  . Smoking status: Never Smoker  . Smokeless tobacco: Former Neurosurgeon  . Tobacco comment: hookah pipe sometimes  Substance and Sexual Activity  . Alcohol use: Yes    Alcohol/week: 1.0 - 2.0 standard drink    Types: 1 - 2 Cans of beer per week    Comment: 1-2 times a week   . Drug use: No  . Sexual activity: Not on file  Other Topics Concern  . Not on file  Social History Narrative  . Not on file   Social Determinants of Health   Financial Resource  Strain: Not on file  Food Insecurity: Not on file  Transportation Needs: Not on file  Physical Activity: Not on file  Stress: Not on file  Social Connections: Not on file  Intimate Partner Violence: Not on file   History reviewed. No pertinent family history.   ASSESSMENT & PLAN:  1. Cough   2. Bronchitis     Meds ordered this encounter  Medications  . albuterol (VENTOLIN HFA) 108 (90 Base) MCG/ACT inhaler    Sig: Inhale 1-2 puffs into the lungs every 6 (six) hours as needed for wheezing or shortness of breath.    Dispense:  18 g    Refill:  0    Order Specific Question:   Supervising Provider    Answer:   Merrilee Jansky [id number]  . predniSONE (DELTASONE) 10 MG tablet    Sig: Take 2 tablets (20 mg total) by mouth daily for 5 days.    Dispense:  10 tablet    Refill:  0    Order Specific Question:   Supervising Provider    Answer:   Merrilee Jansky [id number]    Will treat with above therapy given duration of symptoms.   OTC symptom care as needed. Will plan f/u with PCP or here as needed.  Reviewed expectations re: course of current medical issues. Questions answered. Outlined signs and symptoms indicating need for  more acute intervention. Patient verbalized understanding. After Visit Summary given.           Ivette Loyal, NP 04/03/20 [phone]

## 2020-04-03 NOTE — Discharge Instructions (Addendum)
You likely have viral bronchitis.    Take the prednisone 2 pills a day for 5 days.   Use the albuterol inhaler as needed.   You can also use Claritin or Zyrtec D or mucinex for chest congestion.   If you are not feeling better in the next few days, go to the Emergency Department.

## 2020-06-18 ENCOUNTER — Other Ambulatory Visit: Payer: Self-pay

## 2020-06-18 ENCOUNTER — Encounter (HOSPITAL_COMMUNITY): Payer: Self-pay | Admitting: Emergency Medicine

## 2020-06-18 ENCOUNTER — Ambulatory Visit (HOSPITAL_COMMUNITY)
Admission: EM | Admit: 2020-06-18 | Discharge: 2020-06-18 | Disposition: A | Discharge status: Home / Self Care | Payer: Self-pay | Attending: Student | Admitting: Student

## 2020-06-18 DIAGNOSIS — Z09 Encounter for follow-up examination after completed treatment for conditions other than malignant neoplasm: Secondary | ICD-10-CM

## 2020-06-18 DIAGNOSIS — Z760 Encounter for issue of repeat prescription: Secondary | ICD-10-CM

## 2020-06-18 DIAGNOSIS — J301 Allergic rhinitis due to pollen: Secondary | ICD-10-CM

## 2020-06-18 DIAGNOSIS — J069 Acute upper respiratory infection, unspecified: Secondary | ICD-10-CM

## 2020-06-18 DIAGNOSIS — J209 Acute bronchitis, unspecified: Secondary | ICD-10-CM

## 2020-06-18 DIAGNOSIS — Z8616 Personal history of COVID-19: Secondary | ICD-10-CM

## 2020-06-18 DIAGNOSIS — J4521 Mild intermittent asthma with (acute) exacerbation: Secondary | ICD-10-CM

## 2020-06-18 DIAGNOSIS — Z8679 Personal history of other diseases of the circulatory system: Secondary | ICD-10-CM

## 2020-06-18 MED ORDER — PROMETHAZINE-DM 6.25-15 MG/5ML PO SYRP: 5.0000 mL | ORAL_SOLUTION | Freq: Four times a day (QID) | ORAL | 0 refills | Status: DC | PRN

## 2020-06-18 MED ORDER — ALBUTEROL SULFATE HFA 108 (90 BASE) MCG/ACT IN AERS: 1.0000 | INHALATION_SPRAY | Freq: Four times a day (QID) | RESPIRATORY_TRACT | 0 refills | Status: DC | PRN

## 2020-06-18 MED ORDER — PREDNISONE 10 MG (21) PO TBPK: ORAL_TABLET | Freq: Every day | ORAL | 0 refills | Status: DC

## 2020-06-18 NOTE — Discharge Instructions (Addendum)
-  Prednisone taper for cough/bronchitis. I recommend taking this in the morning as it could give you energy.   -Promethazine DM cough syrup for congestion/cough. This could make you drowsy, so take at night before bed. -Continue your allergy medications. -Continue albuterol inhaler as needed for cough and shortness of breath. -Seek additional medical attention if you develop new symptoms like worsening cough despite treatment, new shortness of breath, new chest pain at rest, dizziness, palpitations, etc.

## 2020-06-18 NOTE — ED Triage Notes (Signed)
Allergy symptoms 2 weeks ago.  Seemed to improve, but had a lingering cough.  Last night woke with a fever, 100.4.  Cough continues, tightness in chest ( uses inhaler).  Generalized aches and chills.

## 2020-06-18 NOTE — ED Provider Notes (Signed)
[facility]    CSN: [id number] Arrival date & time: 06/18/20  1306      History   Chief Complaint Chief Complaint  Patient presents with  . URI    HPI Wayne Gomez is a 30 y.o. male presenting with a cough, nasal congestion x2 weeks. Medical history asthma controlled on albuterol inhaler, palpitations, WPW s/p ablation 05/2012. Notes 2 weeks of symptoms.  Initially attributed these to allergies, and they seem to improve, with zyrtec and flonase albeit with a lingering productive cough with dark yellow sputum.  States he woke up 1 night ago with a fever of 100.4 at home, though he has checked this since then and it was 97.   Some tightness in chest, he does use an inhaler and this improves.  Generalized body aches and subjective chills. Denies shortness of breath, dizziness, palpitations, weakness, headaches. States he had covid19 01/2020 and then bronchitis 03/2020. No issues with WPW since ablation.  HPI  Past Medical History:  Diagnosis Date  . Asthma   . Palpitations   . Ventricular pre-excitation   . WPW (Wolff-Parkinson-White syndrome)    a. ablation of R lateral accessory pathway 06/27/12.    Patient Active Problem List   Diagnosis Date Noted  . Ventricular pre-excitation 05/19/2012  . Palpitations 05/19/2012  . Abnormal physical evaluation 05/19/2012    Past Surgical History:  Procedure Laterality Date  . SUPRAVENTRICULAR TACHYCARDIA ABLATION  06/27/2012  . SUPRAVENTRICULAR TACHYCARDIA ABLATION N/A 06/27/2012   Procedure: SUPRAVENTRICULAR TACHYCARDIA ABLATION;  Surgeon: Marinus Maw, MD;  Location: [facility] C[facility] LAB;  Service: Cardiovascular;  Laterality: N/A;       Home Medications    Prior to Admission medications   Medication Sig Start Date End Date Taking? Authorizing Provider  albuterol (VENTOLIN HFA) 108 (90 Base) MCG/ACT inhaler Inhale 1-2 puffs into the lungs every 6 (six) hours as needed for wheezing or shortness of breath. 06/18/20  Yes Rhys Martini, PA-C  predniSONE (STERAPRED UNI-PAK 21 TAB) 10 MG (21) TBPK tablet Take by mouth daily. Take 6 tabs by mouth daily  for 2 days, then 5 tabs for 2 days, then 4 tabs for 2 days, then 3 tabs for 2 days, 2 tabs for 2 days, then 1 tab by mouth daily for 2 days 06/18/20  Yes Rhys Martini, PA-C  promethazine-dextromethorphan (PROMETHAZINE-DM) 6.25-15 MG/5ML syrup Take 5 mLs by mouth 4 (four) times daily as needed for cough. 06/18/20  Yes Rhys Martini, PA-C    Family History Family History  Problem Relation Age of Onset  . Hypertension Mother   . Diabetes Father   . Cancer Father     Social History Social History   Tobacco Use  . Smoking status: Never Smoker  . Smokeless tobacco: Former Neurosurgeon  . Tobacco comment: hookah pipe sometimes  Vaping Use  . Vaping Use: Never used  Substance Use Topics  . Alcohol use: Yes    Alcohol/week: 1.0 - 2.0 standard drink    Types: 1 - 2 Cans of beer per week    Comment: 1-2 times a week   . Drug use: No     Allergies   Patient has no known allergies.   Review of Systems Review of Systems  Constitutional: Positive for chills and fever. Negative for appetite change.  HENT: Positive for congestion. Negative for ear pain, rhinorrhea, sinus pressure, sinus pain and sore throat.   Eyes: Negative for redness and visual disturbance.  Respiratory: Positive  for cough and chest tightness. Negative for shortness of breath and wheezing.   Cardiovascular: Negative for chest pain and palpitations.  Gastrointestinal: Negative for abdominal pain, constipation, diarrhea, nausea and vomiting.  Genitourinary: Negative for dysuria, frequency and urgency.  Musculoskeletal: Negative for myalgias.  Neurological: Negative for dizziness, weakness and headaches.  Psychiatric/Behavioral: Negative for confusion.  All other systems reviewed and are negative.    Physical Exam Triage Vital Signs ED Triage Vitals  Enc Vitals Group     BP 06/18/20 1506 130/81      Pulse Rate 06/18/20 1506 96     Resp 06/18/20 1506 20     Temp 06/18/20 1506 99 F (37.2 C)     Temp Source 06/18/20 1506 Oral     SpO2 06/18/20 1506 99 %     Weight --      Height --      Head Circumference --      Peak Flow --      Pain Score 06/18/20 1501 5     Pain Loc --      Pain Edu? --      Excl. in GC? --    No data found.  Updated Vital Signs BP 130/81 (BP Location: Right Arm)   Pulse 96   Temp 99 F (37.2 C) (Oral)   Resp 20   SpO2 99%   Visual Acuity Right Eye Distance:   Left Eye Distance:   Bilateral Distance:    Right Eye Near:   Left Eye Near:    Bilateral Near:     Physical Exam Vitals reviewed.  Constitutional:      General: He is not in acute distress.    Appearance: Normal appearance. He is not ill-appearing or diaphoretic.  HENT:     Head: Normocephalic and atraumatic.     Right Ear: Hearing, tympanic membrane, ear canal and external ear normal. No swelling or tenderness. There is no impacted cerumen. No mastoid tenderness. Tympanic membrane is not perforated, erythematous, retracted or bulging.     Left Ear: Hearing, tympanic membrane, ear canal and external ear normal. No swelling or tenderness. There is no impacted cerumen. No mastoid tenderness. Tympanic membrane is not perforated, erythematous, retracted or bulging.     Nose:     Right Sinus: No maxillary sinus tenderness or frontal sinus tenderness.     Left Sinus: No maxillary sinus tenderness or frontal sinus tenderness.     Mouth/Throat:     Mouth: Mucous membranes are moist.     Pharynx: Uvula midline. No oropharyngeal exudate or posterior oropharyngeal erythema.     Tonsils: No tonsillar exudate.  Eyes:     Extraocular Movements: Extraocular movements intact.     Pupils: Pupils are equal, round, and reactive to light.  Cardiovascular:     Rate and Rhythm: Normal rate and regular rhythm.     Pulses:          Radial pulses are 2+ on the right side and 2+ on the left side.      Heart sounds: Normal heart sounds.  Pulmonary:     Effort: Pulmonary effort is normal.     Breath sounds: Normal breath sounds and air entry. No decreased breath sounds, wheezing, rhonchi or rales.     Comments: Frequent hacking cough Chest:     Chest wall: No tenderness.     Comments: Reproducible tenderness along L sternal border Abdominal:     General: Abdomen is flat. Bowel sounds are normal.  Palpations: Abdomen is soft.     Tenderness: There is no abdominal tenderness. There is no guarding or rebound.  Musculoskeletal:     Right lower leg: No edema.     Left lower leg: No edema.  Lymphadenopathy:     Cervical: No cervical adenopathy.  Skin:    General: Skin is warm.     Capillary Refill: Capillary refill takes less than 2 seconds.  Neurological:     General: No focal deficit present.     Mental Status: He is alert and oriented to person, place, and time.  Psychiatric:        Attention and Perception: Attention and perception normal.        Mood and Affect: Mood and affect normal.        Behavior: Behavior normal. Behavior is cooperative.        Thought Content: Thought content normal.        Judgment: Judgment normal.      UC Treatments / Results  Labs (all labs ordered are listed, but only abnormal results are displayed) Labs Reviewed - No data to display  EKG   Radiology No results found.  Procedures Procedures (including critical care time)  Medications Ordered in UC Medications - No data to display  Initial Impression / Assessment and Plan / UC Course  I have reviewed the triage vital signs and the nursing notes.  Pertinent labs & imaging results that were available during my care of the patient were reviewed by me and considered in my medical decision making (see chart for details).     This patient is a 30 year old male presenting with viral bronchitis and asthma exacerbation. Today this pt is afebrile nontachycardic nontachypneic, oxygenating  well on room air, no wheezes rhonchi or rales. Oxygenating comfortably on room air. Very low suspicion for pneumonia. History covid19 five months ago, and this is the second episode of bronchitis since then.  Patient with diagnosis of asthma, and history of WPW status post ablation 2014.  He does have some chest tightness today that is improved with his albuterol inhaler.  Chest tightness is reproducible.  Given history, we did check an EKG NSR, unchanged from 2015 EKG.  Prednisone taper as below.  Promethazine DM.  Continue albuterol and allergy medications. Refilled albuterol.  Given two weeks of symptoms, covid19 test deferred.  Coding this visit a level 4 for one chronic illness with exacerbation (asthma), with prescription drug management.  Strict ED return precautions discussed.  Final Clinical Impressions(s) / UC Diagnoses   Final diagnoses:  Viral URI with cough  History of COVID-19  Acute bronchitis, unspecified organism  History of Wolff-Parkinson-White (WPW) syndrome  Seasonal allergic rhinitis due to pollen  Mild intermittent asthma with acute exacerbation  Medication refill     Discharge Instructions     -Prednisone taper for cough/bronchitis. I recommend taking this in the morning as it could give you energy.   -Promethazine DM cough syrup for congestion/cough. This could make you drowsy, so take at night before bed. -Continue your allergy medications. -Continue albuterol inhaler as needed for cough and shortness of breath. -Seek additional medical attention if you develop new symptoms like worsening cough despite treatment, new shortness of breath, new chest pain at rest, dizziness, palpitations, etc.     ED Prescriptions    Medication Sig Dispense Auth. Provider   predniSONE (STERAPRED UNI-PAK 21 TAB) 10 MG (21) TBPK tablet Take by mouth daily. Take 6 tabs by mouth daily  for  2 days, then 5 tabs for 2 days, then 4 tabs for 2 days, then 3 tabs for 2 days, 2 tabs  for 2 days, then 1 tab by mouth daily for 2 days 42 tablet Rhys MartiniGraham, Nycole Kawahara E, PA-C   promethazine-dextromethorphan (PROMETHAZINE-DM) 6.25-15 MG/5ML syrup Take 5 mLs by mouth 4 (four) times daily as needed for cough. 118 mL Rhys MartiniGraham, China Deitrick E, PA-C   albuterol (VENTOLIN HFA) 108 (90 Base) MCG/ACT inhaler Inhale 1-2 puffs into the lungs every 6 (six) hours as needed for wheezing or shortness of breath. 1 each Rhys MartiniGraham, Chandra Asher E, PA-C     PDMP not reviewed this encounter.   Rhys MartiniGraham, Ashir Kunz E, PA-C 06/18/20 1614

## 2022-01-24 ENCOUNTER — Emergency Department (HOSPITAL_BASED_OUTPATIENT_CLINIC_OR_DEPARTMENT_OTHER)
Admission: EM | Admit: 2022-01-24 | Discharge: 2022-01-24 | Discharge status: Against Medical Advice | Payer: 59 | Attending: Emergency Medicine | Admitting: Emergency Medicine

## 2022-01-24 ENCOUNTER — Emergency Department (HOSPITAL_BASED_OUTPATIENT_CLINIC_OR_DEPARTMENT_OTHER): Payer: 59 | Admitting: Radiology

## 2022-01-24 ENCOUNTER — Encounter (HOSPITAL_BASED_OUTPATIENT_CLINIC_OR_DEPARTMENT_OTHER): Payer: Self-pay

## 2022-01-24 ENCOUNTER — Other Ambulatory Visit: Payer: Self-pay

## 2022-01-24 DIAGNOSIS — R079 Chest pain, unspecified: Secondary | ICD-10-CM | POA: Diagnosis present

## 2022-01-24 DIAGNOSIS — Z5321 Procedure and treatment not carried out due to patient leaving prior to being seen by health care provider: Secondary | ICD-10-CM | POA: Diagnosis not present

## 2022-01-24 LAB — BASIC METABOLIC PANEL
Anion gap: 9 (ref 5–15)
BUN: 11 mg/dL (ref 6–20)
CO2: 29 mmol/L (ref 22–32)
Calcium: 9.7 mg/dL (ref 8.9–10.3)
Chloride: 99 mmol/L (ref 98–111)
Creatinine, Ser: 1.01 mg/dL (ref 0.61–1.24)
GFR, Estimated: >60 mL/min (ref >60)
Glucose, Bld: 100 mg/dL — ABNORMAL HIGH (ref 70–99)
Potassium: 4.3 mmol/L (ref 3.5–5.1)
Sodium: 137 mmol/L (ref 135–145)

## 2022-01-24 LAB — CBC
HCT: 45.5 % (ref 39.0–52.0)
Hemoglobin: 16.6 g/dL (ref 13.0–17.0)
MCH: 30.1 pg (ref 26.0–34.0)
MCHC: 36.5 g/dL — ABNORMAL HIGH (ref 30.0–36.0)
MCV: 82.4 fL (ref 80.0–100.0)
Platelets: 146 10*3/uL — ABNORMAL LOW (ref 150–400)
RBC: 5.52 MIL/uL (ref 4.22–5.81)
RDW: 13.7 % (ref 11.5–15.5)
WBC: 9.2 10*3/uL (ref 4.0–10.5)
nRBC: 0 % (ref 0.0–0.2)

## 2022-01-24 LAB — TROPONIN I (HIGH SENSITIVITY): Troponin I (High Sensitivity): 4 ng/L (ref <18)

## 2022-01-24 NOTE — ED Notes (Signed)
Pt called x2 for vitals, no answer

## 2022-01-24 NOTE — ED Provider Triage Note (Signed)
Emergency Medicine Provider Triage Evaluation Note  Wayne Gomez , a 31 y.o. male  was evaluated in triage.  Pt complains of chest pain since yesterday. Constant, in the center of the chest, worse with quick movement. Hx of WPW s/p ablation. No nausea, vomiting, fever, shortness of breath, sweating, weakness or numbness.  Review of Systems  Positive: As above Negative: As above  Physical Exam  BP (!) 155/77 (BP Location: Right Arm)   Pulse 91   Temp 98.2 F (36.8 C) (Oral)   Resp 20   Ht 6\' 5"  (1.956 m)   Wt 114.3 kg   SpO2 99%   BMI 29.88 kg/m  Gen:   Awake, no distress   Resp:  Normal effort  MSK:   Moves extremities without difficulty  Other:    Medical Decision Making  Medically screening exam initiated at 1:47 PM.  Appropriate orders placed.  Gomez was informed that the remainder of the evaluation will be completed by another provider, this initial triage assessment does not replace that evaluation, and the importance of remaining in the ED until their evaluation is complete.     Wayne Gomez, Wayne Gomez 01/24/22 1349

## 2022-01-24 NOTE — ED Triage Notes (Signed)
Patient here POV from Home.  Endorses CP that began Yesterday PM. Mid Chest. No SOB. No Fevers. No Cough.   NAD Noted during Triage. A&Ox4. GCS 15. Ambulatory.

## 2023-01-11 ENCOUNTER — Ambulatory Visit (HOSPITAL_COMMUNITY)
Admission: EM | Admit: 2023-01-11 | Discharge: 2023-01-11 | Disposition: A | Discharge status: Home / Self Care | Payer: 59 | Attending: Family Medicine | Admitting: Family Medicine

## 2023-01-11 ENCOUNTER — Encounter (HOSPITAL_COMMUNITY): Payer: Self-pay

## 2023-01-11 ENCOUNTER — Ambulatory Visit (INDEPENDENT_AMBULATORY_CARE_PROVIDER_SITE_OTHER): Payer: 59

## 2023-01-11 DIAGNOSIS — S6991XA Unspecified injury of right wrist, hand and finger(s), initial encounter: Secondary | ICD-10-CM | POA: Diagnosis not present

## 2023-01-11 NOTE — ED Triage Notes (Addendum)
Pt presents to the office for R-hand pinky finger pain x 3 weeks.Patient was playing basketball and finger jam.

## 2023-01-11 NOTE — ED Provider Notes (Signed)
[facility]    CSN: [id number] Arrival date & time: 01/11/23  3244      History   Chief Complaint Chief Complaint  Patient presents with   [name]    HPI Wayne Gomez is a 32 y.o. male.    [name]   Patient is here for right 5th finger pain/deformity.  He jammed the finger 3 weeks ago playing basketball and it has not healed.  Still with pain and swelling at the finger with limited rom.       Past Medical History:  Diagnosis Date   Asthma    Palpitations    Ventricular pre-excitation    WPW (Wolff-Parkinson-White syndrome)    a. ablation of R lateral accessory pathway 06/27/12.    Patient Active Problem List   Diagnosis Date Noted   Ventricular pre-excitation 05/19/2012   Palpitations 05/19/2012   Abnormal physical evaluation 05/19/2012    Past Surgical History:  Procedure Laterality Date   SUPRAVENTRICULAR TACHYCARDIA ABLATION  06/27/2012   SUPRAVENTRICULAR TACHYCARDIA ABLATION N/A 06/27/2012   Procedure: SUPRAVENTRICULAR TACHYCARDIA ABLATION;  Surgeon: Marinus Maw, MD;  Location: [facility] CATH LAB;  Service: Cardiovascular;  Laterality: N/A;       Home Medications    Prior to Admission medications   Medication Sig Start Date End Date Taking? Authorizing Provider  albuterol (VENTOLIN HFA) 108 (90 Base) MCG/ACT inhaler Inhale 1-2 puffs into the lungs every 6 (six) hours as needed for wheezing or shortness of breath. 06/18/20   Rhys Martini, PA-C  predniSONE (STERAPRED UNI-PAK 21 TAB) 10 MG (21) TBPK tablet Take by mouth daily. Take 6 tabs by mouth daily  for 2 days, then 5 tabs for 2 days, then 4 tabs for 2 days, then 3 tabs for 2 days, 2 tabs for 2 days, then 1 tab by mouth daily for 2 days 06/18/20   Rhys Martini, PA-C  promethazine-dextromethorphan (PROMETHAZINE-DM) 6.25-15 MG/5ML syrup Take 5 mLs by mouth 4 (four) times daily as needed for cough. 06/18/20   Rhys Martini, PA-C    Family History Family History  Problem Relation  Age of Onset   Hypertension Mother    Diabetes Father    Cancer Father     Social History Social History   Tobacco Use   Smoking status: Never   Smokeless tobacco: Former    Quit date: 04/28/2012   Tobacco comments:    hookah pipe sometimes  Vaping Use   Vaping status: Never Used  Substance Use Topics   Alcohol use: Yes    Alcohol/week: 1.0 - 2.0 standard drink of alcohol    Types: 1 - 2 Cans of beer per week    Comment: 1-2 times a week    Drug use: No     Allergies   Patient has no known allergies.   Review of Systems Review of Systems  Constitutional: Negative.   HENT: Negative.    Respiratory: Negative.    Cardiovascular: Negative.   Gastrointestinal: Negative.   Musculoskeletal:  Positive for joint swelling.     Physical Exam Triage Vital Signs ED Triage Vitals [01/11/23 0956]  Encounter Vitals Group     BP (!) 144/84     Systolic BP Percentile      Diastolic BP Percentile      Pulse Rate 87     Resp 18     Temp 98 F (36.7 C)     Temp Source Oral     SpO2 97 %  Weight      Height      Head Circumference      Peak Flow      Pain Score      Pain Loc      Pain Education      Exclude from Growth Chart    No data found.  Updated Vital Signs BP (!) 144/84 (BP Location: Left Arm)   Pulse 87   Temp 98 F (36.7 C) (Oral)   Resp 18   SpO2 97%   Visual Acuity Right Eye Distance:   Left Eye Distance:   Bilateral Distance:    Right Eye Near:   Left Eye Near:    Bilateral Near:     Physical Exam Constitutional:      Appearance: Normal appearance.  Musculoskeletal:     Comments: Deformity noted to the right 5th finger.   Swelling to the PIP joint, TTP to the PIP joint;  unable to bend/extend fully  Neurological:     General: No focal deficit present.     Mental Status: He is alert.  Psychiatric:        Mood and Affect: Mood normal.      UC Treatments / Results  Labs (all labs ordered are listed, but only abnormal results are  displayed) Labs Reviewed - No data to display  EKG   Radiology DG Finger Little Right Result Date: 01/11/2023 CLINICAL DATA:  Right small finger pain after injury 3 weeks ago EXAM: RIGHT LITTLE FINGER 2+V COMPARISON:  None Available. FINDINGS: There is no evidence of fracture or dislocation. There is no evidence of arthropathy or other focal bone abnormality. Soft tissues are unremarkable. IMPRESSION: Negative. Electronically Signed   By: Duanne Guess D.O.   On: 01/11/2023 10:40    Procedures Procedures (including critical care time)  Medications Ordered in UC Medications - No data to display  Initial Impression / Assessment and Plan / UC Course  I have reviewed the triage vital signs and the nursing notes.  Pertinent labs & imaging results that were available during my care of the patient were reviewed by me and considered in my medical decision making (see chart for details).   Final Clinical Impressions(s) / UC Diagnoses   Final diagnoses:  Injury of finger of right Gomez, initial encounter     Discharge Instructions      You were seen today for finger injury.  Your xray was negative for fracture.  You may have a tendon injury causing pain and deformity.  You need to follow up with a Gomez specialist. You may call [facility] at [phone] to make an appointment.     ED Prescriptions   None    PDMP not reviewed this encounter.   Jannifer Franklin, MD 01/11/23 1044

## 2023-01-11 NOTE — Discharge Instructions (Signed)
You were seen today for finger injury.  Your xray was negative for fracture.  You may have a tendon injury causing pain and deformity.  You need to follow up with a hand specialist. You may call [facility] at [phone] to make an appointment.

## 2023-03-17 ENCOUNTER — Ambulatory Visit (HOSPITAL_COMMUNITY)
Admission: EM | Admit: 2023-03-17 | Discharge: 2023-03-17 | Disposition: A | Discharge status: Home / Self Care | Payer: 59 | Attending: Internal Medicine | Admitting: Internal Medicine

## 2023-03-17 ENCOUNTER — Encounter (HOSPITAL_COMMUNITY): Payer: Self-pay | Admitting: *Deleted

## 2023-03-17 ENCOUNTER — Other Ambulatory Visit: Payer: Self-pay

## 2023-03-17 DIAGNOSIS — D696 Thrombocytopenia, unspecified: Secondary | ICD-10-CM | POA: Insufficient documentation

## 2023-03-17 DIAGNOSIS — R140 Abdominal distension (gaseous): Secondary | ICD-10-CM | POA: Diagnosis present

## 2023-03-17 DIAGNOSIS — S301XXA Contusion of abdominal wall, initial encounter: Secondary | ICD-10-CM | POA: Insufficient documentation

## 2023-03-17 LAB — CBC WITH DIFFERENTIAL/PLATELET
Abs Immature Granulocytes: 0.04 10*3/uL (ref 0.00–0.07)
Basophils Absolute: 0 10*3/uL (ref 0.0–0.1)
Basophils Relative: 0 %
Eosinophils Absolute: 0.2 10*3/uL (ref 0.0–0.5)
Eosinophils Relative: 2 %
HCT: 44.4 % (ref 39.0–52.0)
Hemoglobin: 16.3 g/dL (ref 13.0–17.0)
Immature Granulocytes: 0 %
Lymphocytes Relative: 25 %
Lymphs Abs: 2.7 10*3/uL (ref 0.7–4.0)
MCH: 29.8 pg (ref 26.0–34.0)
MCHC: 36.7 g/dL — ABNORMAL HIGH (ref 30.0–36.0)
MCV: 81.2 fL (ref 80.0–100.0)
Monocytes Absolute: 0.9 10*3/uL (ref 0.1–1.0)
Monocytes Relative: 8 %
Neutro Abs: 6.9 10*3/uL (ref 1.7–7.7)
Neutrophils Relative %: 65 %
Platelets: 142 10*3/uL — ABNORMAL LOW (ref 150–400)
RBC: 5.47 MIL/uL (ref 4.22–5.81)
RDW: 13.9 % (ref 11.5–15.5)
WBC: 10.7 10*3/uL — ABNORMAL HIGH (ref 4.0–10.5)
nRBC: 0 % (ref 0.0–0.2)

## 2023-03-17 LAB — PROTIME-INR
INR: 1.1 (ref 0.8–1.2)
Prothrombin Time: 14 s (ref 11.4–15.2)

## 2023-03-17 NOTE — ED Provider Notes (Signed)
 [facility]    CSN: [id number] Arrival date & time: 03/17/23  1504      History   Chief Complaint Chief Complaint  Patient presents with   bruise   Abdominal Pain    HPI Wayne Gomez is a 33 y.o. male.   33 year old male who presents urgent care with complaints of a bruise with a knot on the left lower quadrant.  He noticed this about 3 days ago.  He denies any injury to the area and denies any injection to the area.  He does not recall lifting anything heavy.  He does relate that he had a low platelet count on his lab work that was done last year.  This was never further evaluated and he has had no lab work since that time.  His platelet count was 123 in March 2024.   He relates that he has had chronic ongoing abdominal bloating as well but this is not new.  He has a discomfort from the bloating.  He is currently searching for a new primary care physician.  He would like to find one on his own.   Abdominal Pain   Past Medical History:  Diagnosis Date   Asthma    Palpitations    Ventricular pre-excitation    WPW (Wolff-Parkinson-Rajvi Armentor syndrome)    a. ablation of R lateral accessory pathway 06/27/12.    Patient Active Problem List   Diagnosis Date Noted   Ventricular pre-excitation 05/19/2012   Palpitations 05/19/2012   Abnormal physical evaluation 05/19/2012    Past Surgical History:  Procedure Laterality Date   SUPRAVENTRICULAR TACHYCARDIA ABLATION  06/27/2012   SUPRAVENTRICULAR TACHYCARDIA ABLATION N/A 06/27/2012   Procedure: SUPRAVENTRICULAR TACHYCARDIA ABLATION;  Surgeon: Marinus Maw, MD;  Location: [facility] CATH LAB;  Service: Cardiovascular;  Laterality: N/A;       Home Medications    Prior to Admission medications   Medication Sig Start Date End Date Taking? Authorizing Provider  albuterol (VENTOLIN HFA) 108 (90 Base) MCG/ACT inhaler Inhale 1-2 puffs into the lungs every 6 (six) hours as needed for wheezing or shortness of breath. 06/18/20  Yes  Rhys Martini, PA-C    Family History Family History  Problem Relation Age of Onset   Hypertension Mother    Diabetes Father    Cancer Father     Social History Social History   Tobacco Use   Smoking status: Never   Smokeless tobacco: Former    Quit date: 04/28/2012   Tobacco comments:    hookah pipe sometimes  Vaping Use   Vaping status: Never Used  Substance Use Topics   Alcohol use: Yes    Alcohol/week: 1.0 - 2.0 standard drink of alcohol    Types: 1 - 2 Cans of beer per week    Comment: 1-2 times a week    Drug use: No     Allergies   Patient has no known allergies.   Review of Systems Review of Systems  Gastrointestinal:  Positive for abdominal pain.     Physical Exam Triage Vital Signs ED Triage Vitals  Encounter Vitals Group     BP 03/17/23 1626 134/85     Systolic BP Percentile --      Diastolic BP Percentile --      Pulse Rate 03/17/23 1626 (!) 107     Resp 03/17/23 1626 20     Temp 03/17/23 1626 98.4 F (36.9 C)     Temp src --  SpO2 03/17/23 1626 96 %     Weight --      Height --      Head Circumference --      Peak Flow --      Pain Score 03/17/23 1623 5     Pain Loc --      Pain Education --      Exclude from Growth Chart --    No data found.  Updated Vital Signs BP 134/85   Pulse (!) 107   Temp 98.4 F (36.9 C)   Resp 20   SpO2 96%   Visual Acuity Right Eye Distance:   Left Eye Distance:   Bilateral Distance:    Right Eye Near:   Left Eye Near:    Bilateral Near:     Physical Exam Vitals and nursing note reviewed.  Constitutional:      General: He is not in acute distress.    Appearance: He is well-developed.  HENT:     Head: Normocephalic and atraumatic.  Eyes:     Conjunctiva/sclera: Conjunctivae normal.  Cardiovascular:     Rate and Rhythm: Normal rate and regular rhythm.     Heart sounds: No murmur heard. Pulmonary:     Effort: Pulmonary effort is normal. No respiratory distress.     Breath sounds:  Normal breath sounds.  Abdominal:     General: Bowel sounds are normal. There is no distension. There are no signs of injury.     Palpations: Abdomen is soft.     Tenderness: There is no abdominal tenderness. There is no guarding or rebound. Negative signs include Murphy's sign.     Hernia: No hernia is present.    Musculoskeletal:        General: No swelling.     Cervical back: Neck supple.  Skin:    General: Skin is warm and dry.     Capillary Refill: Capillary refill takes less than 2 seconds.  Neurological:     Mental Status: He is alert.  Psychiatric:        Mood and Affect: Mood normal.      UC Treatments / Results  Labs (all labs ordered are listed, but only abnormal results are displayed) Labs Reviewed  CBC WITH DIFFERENTIAL/PLATELET  PROTIME-INR    EKG   Radiology No results found.  Procedures Procedures (including critical care time)  Medications Ordered in UC Medications - No data to display  Initial Impression / Assessment and Plan / UC Course  I have reviewed the triage vital signs and the nursing notes.  Pertinent labs & imaging results that were available during my care of the patient were reviewed by me and considered in my medical decision making (see chart for details).     Abdominal wall hematoma, initial encounter  Low platelet count (HCC)  Abdominal bloating  Left lower quadrant hematoma in the abdominal wall that was spontaneous.  This is a bad bruise that has formed a knot in the area.  The concern is that this may be due to to a low platelet count and CBC and clotting studies are drawn today.  These results will be available in 24 to 48 hours.  If your platelet count is still low then you will likely need to follow-up with a hematologist which is a doctor who evaluates blood.  We will contact you if the results are abnormal and can assist with recommendations for hematology.  For the chronic bloating, recommend following up with primary  care to discuss symptoms and possible referral to gastroenterology.  Please schedule a follow-up appointment with your primary care physician as soon as possible to discuss the above issues.  Return to urgent care or PCP if symptoms worsen or fail to resolve.   Final Clinical Impressions(s) / UC Diagnoses   Final diagnoses:  Abdominal wall hematoma, initial encounter  Low platelet count (HCC)  Abdominal bloating     Discharge Instructions      Left lower quadrant hematoma in the abdominal wall that was spontaneous.  This is a bad bruise that has formed a knot in the area.  The concern is that this may be due to to a low platelet count and CBC and clotting studies are drawn today.  These results will be available in 24 to 48 hours.  If your platelet count is still low then you will likely need to follow-up with a hematologist which is a doctor who evaluates blood.  We will contact you if the results are abnormal and can assist with recommendations for hematology.  For the chronic bloating, recommend following up with primary care to discuss symptoms and possible referral to gastroenterology.  Please schedule a follow-up appointment with your primary care physician as soon as possible to discuss the above issues.  Return to urgent care or PCP if symptoms worsen or fail to resolve.     ED Prescriptions   None    PDMP not reviewed this encounter.   Landis Martins, [place] 03/17/23 1712

## 2023-03-17 NOTE — Discharge Instructions (Addendum)
 Left lower quadrant hematoma in the abdominal wall that was spontaneous.  This is a bad bruise that has formed a knot in the area.  The concern is that this may be due to to a low platelet count and CBC and clotting studies are drawn today.  These results will be available in 24 to 48 hours.  If your platelet count is still low then you will likely need to follow-up with a hematologist which is a doctor who evaluates blood.  We will contact you if the results are abnormal and can assist with recommendations for hematology.  For the chronic bloating, recommend following up with primary care to discuss symptoms and possible referral to gastroenterology.  Please schedule a follow-up appointment with your primary care physician as soon as possible to discuss the above issues.  Return to urgent care or PCP if symptoms worsen or fail to resolve.

## 2023-03-17 NOTE — ED Triage Notes (Signed)
 PT reports he has had a bruise on Lt side of ABD for 3 days. Pt now has a knot located at bruise. Pt does not remember any injury to area to cause bruise.

## 2023-03-22 ENCOUNTER — Ambulatory Visit
Admission: EM | Admit: 2023-03-22 | Discharge: 2023-03-22 | Disposition: A | Discharge status: Home / Self Care | Payer: 59 | Attending: Family Medicine | Admitting: Family Medicine

## 2023-03-22 DIAGNOSIS — B349 Viral infection, unspecified: Secondary | ICD-10-CM | POA: Diagnosis not present

## 2023-03-22 DIAGNOSIS — J4521 Mild intermittent asthma with (acute) exacerbation: Secondary | ICD-10-CM | POA: Diagnosis not present

## 2023-03-22 DIAGNOSIS — R051 Acute cough: Secondary | ICD-10-CM

## 2023-03-22 DIAGNOSIS — R0789 Other chest pain: Secondary | ICD-10-CM

## 2023-03-22 LAB — POC COVID19/FLU A&B COMBO
Covid Antigen, POC: NEGATIVE
Influenza A Antigen, POC: NEGATIVE
Influenza B Antigen, POC: NEGATIVE

## 2023-03-22 MED ORDER — ALBUTEROL SULFATE (2.5 MG/3ML) 0.083% IN NEBU: 2.5000 mg | INHALATION_SOLUTION | Freq: Four times a day (QID) | RESPIRATORY_TRACT | 0 refills | Status: DC | PRN

## 2023-03-22 MED ORDER — PREDNISONE 20 MG PO TABS
40.0000 mg | ORAL_TABLET | Freq: Every day | ORAL | 0 refills | Status: AC
Start: 2023-03-22 — End: 2023-03-27

## 2023-03-22 MED ORDER — ALBUTEROL SULFATE HFA 108 (90 BASE) MCG/ACT IN AERS: 1.0000 | INHALATION_SPRAY | Freq: Four times a day (QID) | RESPIRATORY_TRACT | 0 refills | Status: DC | PRN

## 2023-03-22 MED ORDER — IPRATROPIUM-ALBUTEROL 0.5-2.5 (3) MG/3ML IN SOLN
3.0000 mL | Freq: Once | RESPIRATORY_TRACT | Status: AC
  Administered 2023-03-22: 3 mL via RESPIRATORY_TRACT

## 2023-03-22 NOTE — ED Triage Notes (Signed)
 Pt presents to UC for c/o cough, chest tightness, body aches, nasal congestion x3 days. Using nyquil, inhaler, nasal spray, allergy med w/o relief

## 2023-03-22 NOTE — ED Provider Notes (Signed)
 [facility]    CSN: [id number] Arrival date & time: 03/22/23  0840      History   Chief Complaint Chief Complaint  Patient presents with   Cough    HPI Wayne Gomez is a 33 y.o. male  presents for evaluation of URI symptoms for 3 days. Patient reports associated symptoms of cough, congestion, body aches, chest tightness/shortness of breath. Denies N/V/D, fevers, ear pain, sore throat. Patient does have a hx of asthma.  Has an albuterol inhaler which she has been using with minimal improvement.  Also states he has a home nebulizers but is out of his solution.  Patient is not an active smoker.   Reports no sick contacts.  Pt has taken DayQuil/NyQuil, nasal spray, allergy medicine OTC for symptoms. Pt has no other concerns at this time.    Cough Associated symptoms: myalgias and shortness of breath     Past Medical History:  Diagnosis Date   Asthma    Palpitations    Ventricular pre-excitation    WPW (Wolff-Parkinson-White syndrome)    a. ablation of R lateral accessory pathway 06/27/12.    Patient Active Problem List   Diagnosis Date Noted   Ventricular pre-excitation 05/19/2012   Palpitations 05/19/2012   Abnormal physical evaluation 05/19/2012    Past Surgical History:  Procedure Laterality Date   SUPRAVENTRICULAR TACHYCARDIA ABLATION  06/27/2012   SUPRAVENTRICULAR TACHYCARDIA ABLATION N/A 06/27/2012   Procedure: SUPRAVENTRICULAR TACHYCARDIA ABLATION;  Surgeon: Marinus Maw, MD;  Location: [facility] CATH LAB;  Service: Cardiovascular;  Laterality: N/A;       Home Medications    Prior to Admission medications   Medication Sig Start Date End Date Taking? Authorizing Provider  albuterol (PROVENTIL) (2.5 MG/3ML) 0.083% nebulizer solution Take 3 mLs (2.5 mg total) by nebulization every 6 (six) hours as needed for wheezing or shortness of breath. 03/22/23  Yes Radford Pax, NP  albuterol (VENTOLIN HFA) 108 (90 Base) MCG/ACT inhaler Inhale 1-2 puffs into the  lungs every 6 (six) hours as needed. 03/22/23  Yes Radford Pax, NP  predniSONE (DELTASONE) 20 MG tablet Take 2 tablets (40 mg total) by mouth daily with breakfast for 5 days. 03/22/23 03/27/23 Yes Radford Pax, NP    Family History Family History  Problem Relation Age of Onset   Hypertension Mother    Diabetes Father    Cancer Father     Social History Social History   Tobacco Use   Smoking status: Never   Smokeless tobacco: Former    Quit date: 04/28/2012   Tobacco comments:    hookah pipe sometimes  Vaping Use   Vaping status: Never Used  Substance Use Topics   Alcohol use: Yes    Alcohol/week: 1.0 - 2.0 standard drink of alcohol    Types: 1 - 2 Cans of beer per week    Comment: 1-2 times a week    Drug use: No     Allergies   Patient has no known allergies.   Review of Systems Review of Systems  HENT:  Positive for congestion.   Respiratory:  Positive for cough, chest tightness and shortness of breath.   Musculoskeletal:  Positive for myalgias.     Physical Exam Triage Vital Signs ED Triage Vitals  Encounter Vitals Group     BP 03/22/23 0851 129/81     Systolic BP Percentile --      Diastolic BP Percentile --      Pulse Rate 03/22/23 0851 Marland Kitchen)  110     Resp 03/22/23 0851 20     Temp 03/22/23 0851 99.9 F (37.7 C)     Temp Source 03/22/23 0851 Oral     SpO2 03/22/23 0851 95 %     Weight --      Height --      Head Circumference --      Peak Flow --      Pain Score 03/22/23 0849 0     Pain Loc --      Pain Education --      Exclude from Growth Chart --    No data found.  Updated Vital Signs BP 129/81 (BP Location: Right Arm)   Pulse (!) 110   Temp 99.9 F (37.7 C) (Oral)   Resp 20   SpO2 95%   Visual Acuity Right Eye Distance:   Left Eye Distance:   Bilateral Distance:    Right Eye Near:   Left Eye Near:    Bilateral Near:     Physical Exam Vitals and nursing note reviewed.  Constitutional:      General: He is not in acute  distress.    Appearance: Normal appearance. He is not ill-appearing or toxic-appearing.  HENT:     Head: Normocephalic and atraumatic.     Right Ear: Tympanic membrane and ear canal normal.     Left Ear: Tympanic membrane and ear canal normal.     Nose: Congestion present.     Mouth/Throat:     Mouth: Mucous membranes are moist.     Pharynx: No posterior oropharyngeal erythema.  Eyes:     Pupils: Pupils are equal, round, and reactive to light.  Cardiovascular:     Rate and Rhythm: Regular rhythm. Tachycardia present.     Heart sounds: Normal heart sounds.  Pulmonary:     Effort: Pulmonary effort is normal.     Breath sounds: Normal breath sounds. No wheezing or rhonchi.  Musculoskeletal:     Cervical back: Normal range of motion and neck supple.  Lymphadenopathy:     Cervical: No cervical adenopathy.  Skin:    General: Skin is warm and dry.  Neurological:     General: No focal deficit present.     Mental Status: He is alert and oriented to person, place, and time.  Psychiatric:        Mood and Affect: Mood normal.        Behavior: Behavior normal.      UC Treatments / Results  Labs (all labs ordered are listed, but only abnormal results are displayed) Labs Reviewed  POC COVID19/FLU A&B COMBO    EKG   Radiology No results found.  Procedures Procedures (including critical care time)  Medications Ordered in UC Medications  ipratropium-albuterol (DUONEB) 0.5-2.5 (3) MG/3ML nebulizer solution 3 mL (3 mLs Nebulization Given 03/22/23 0909)    Initial Impression / Assessment and Plan / UC Course  I have reviewed the triage vital signs and the nursing notes.  Pertinent labs & imaging results that were available during my care of the patient were reviewed by me and considered in my medical decision making (see chart for details).     Reviewed exam and symptoms with patient.  No red flags.  Negative rapid flu and COVID testing.  Patient reports improvement in  symptoms after nebulizer.  Discussed viral illness with asthma exacerbation.  Refilled albuterol nebulizer and albuterol inhaler.  Start prednisone daily for 5 days.  Patient prefers to continue OTC  cough medicine as needed.  Encouraged rest and fluids.  PCP follow-up if symptoms do not improve.  ER precautions reviewed and patient verbalized understanding. Final Clinical Impressions(s) / UC Diagnoses   Final diagnoses:  Acute cough  Chest tightness  Viral illness  Mild intermittent asthma with acute exacerbation     Discharge Instructions      I have refilled her albuterol inhaler as well as her nebulizer solution for use as needed for wheezing or shortness of breath.  Start prednisone daily for 5 days.  Lots of fluids and rest.  Continue over-the-counter cough medicine as needed.  Please follow-up with your PCP if your symptoms do not improve.  Please go to the ER if you develop any worsening symptoms.  Hope you feel better soon!     ED Prescriptions     Medication Sig Dispense Auth. Provider   albuterol (VENTOLIN HFA) 108 (90 Base) MCG/ACT inhaler Inhale 1-2 puffs into the lungs every 6 (six) hours as needed. 1 each Radford Pax, NP   albuterol (PROVENTIL) (2.5 MG/3ML) 0.083% nebulizer solution Take 3 mLs (2.5 mg total) by nebulization every 6 (six) hours as needed for wheezing or shortness of breath. 75 mL Radford Pax, NP   predniSONE (DELTASONE) 20 MG tablet Take 2 tablets (40 mg total) by mouth daily with breakfast for 5 days. 10 tablet Radford Pax, NP      PDMP not reviewed this encounter.   Radford Pax, NP 03/22/23 [phone]

## 2023-03-22 NOTE — Discharge Instructions (Signed)
 I have refilled her albuterol inhaler as well as her nebulizer solution for use as needed for wheezing or shortness of breath.  Start prednisone daily for 5 days.  Lots of fluids and rest.  Continue over-the-counter cough medicine as needed.  Please follow-up with your PCP if your symptoms do not improve.  Please go to the ER if you develop any worsening symptoms.  Hope you feel better soon!

## 2023-04-17 ENCOUNTER — Ambulatory Visit
Admission: EM | Admit: 2023-04-17 | Discharge: 2023-04-17 | Disposition: A | Discharge status: Home / Self Care | Attending: Internal Medicine | Admitting: Internal Medicine

## 2023-04-17 DIAGNOSIS — J4531 Mild persistent asthma with (acute) exacerbation: Secondary | ICD-10-CM

## 2023-04-17 DIAGNOSIS — J209 Acute bronchitis, unspecified: Secondary | ICD-10-CM

## 2023-04-17 HISTORY — DX: Cardiac murmur, unspecified: R01.1

## 2023-04-17 MED ORDER — IPRATROPIUM-ALBUTEROL 0.5-2.5 (3) MG/3ML IN SOLN
3.0000 mL | Freq: Once | RESPIRATORY_TRACT | Status: AC
  Administered 2023-04-17: 3 mL via RESPIRATORY_TRACT

## 2023-04-17 MED ORDER — DOXYCYCLINE HYCLATE 100 MG PO CAPS: 100.0000 mg | ORAL_CAPSULE | Freq: Two times a day (BID) | ORAL | 0 refills | Status: AC

## 2023-04-17 MED ORDER — PROAIR DIGIHALER 108 (90 BASE) MCG/ACT IN AEPB: INHALATION_SPRAY | RESPIRATORY_TRACT | 0 refills | Status: AC

## 2023-04-17 MED ORDER — ALBUTEROL SULFATE (2.5 MG/3ML) 0.083% IN NEBU: 2.5000 mg | INHALATION_SOLUTION | RESPIRATORY_TRACT | 0 refills | Status: AC | PRN

## 2023-04-17 MED ORDER — METHYLPREDNISOLONE 4 MG PO TBPK: ORAL_TABLET | ORAL | 0 refills | Status: AC

## 2023-04-17 MED ORDER — ALBUTEROL SULFATE (2.5 MG/3ML) 0.083% IN NEBU
2.5000 mg | INHALATION_SOLUTION | RESPIRATORY_TRACT | 0 refills | Status: DC | PRN
Start: 2023-04-17 — End: 2023-04-17

## 2023-04-17 NOTE — Discharge Instructions (Addendum)
 If you develop a fever or episodes of sweating, please make sure to be seen again since your will need to have a chest xray. Today we do not have a xray tech here.   Right now you do not have any typical symptoms of pneumonia.

## 2023-04-17 NOTE — ED Triage Notes (Addendum)
 Patient presents with "persistent cough, cloudy mucus, tightness in chest, discomfort, fatigue." Symptoms started 3 weeks ago. Treated with inhaler and prednisone without relief. Patient states he is out of nebulizer solution.

## 2023-04-17 NOTE — ED Provider Notes (Signed)
 [facility]    CSN: [id number] Arrival date & time: 04/17/23  1647      History   Chief Complaint No chief complaint on file.   HPI Wayne Gomez is a 33 y.o. male who presents with with unresolved and persistent cough since 3 weeks ago, with wheezing, chest tightness, chest pain when he coughs and fatigue. Was seen 3 weeks ago for URI and asthma exacerbation and given prednisone for 5 days and Neb treatment.  Now is having productive cough with white to cloudy mucous. Has been out of his neb machine for 2 weeks. Has been using his albuterol inhaler q4h, but feels the Proair inhaler had more of a mist than the albuterol one. Would like to see if I could send the Proair inhaler and refills for his neb machine He denies fever or sweats. But has been tired.     Past Medical History:  Diagnosis Date   Asthma    Heart murmur    Palpitations    Ventricular pre-excitation    WPW (Wolff-Parkinson-White syndrome)    a. ablation of R lateral accessory pathway 06/27/12.    Patient Active Problem List   Diagnosis Date Noted   Ventricular pre-excitation 05/19/2012   Palpitations 05/19/2012   Abnormal physical evaluation 05/19/2012    Past Surgical History:  Procedure Laterality Date   SUPRAVENTRICULAR TACHYCARDIA ABLATION  06/27/2012   SUPRAVENTRICULAR TACHYCARDIA ABLATION N/A 06/27/2012   Procedure: SUPRAVENTRICULAR TACHYCARDIA ABLATION;  Surgeon: Marinus Maw, MD;  Location: [facility] CATH LAB;  Service: Cardiovascular;  Laterality: N/A;       Home Medications    Prior to Admission medications   Medication Sig Start Date End Date Taking? Authorizing Provider  Albuterol Sulfate, sensor, (PROAIR DIGIHALER) 108 (90 Base) MCG/ACT AEPB 2 puffs q 4-6 h prn cough attack and wheezing 04/17/23  Yes Rodriguez-Southworth, Nettie Elm, PA-C  doxycycline (VIBRAMYCIN) 100 MG capsule Take 1 capsule (100 mg total) by mouth 2 (two) times daily. 04/17/23  Yes Rodriguez-Southworth, Nettie Elm, PA-C   methylPREDNISolone (MEDROL DOSEPAK) 4 MG TBPK tablet Take as directed per pack 04/17/23  Yes Rodriguez-Southworth, Nettie Elm, PA-C  albuterol (PROVENTIL) (2.5 MG/3ML) 0.083% nebulizer solution Take 3 mLs (2.5 mg total) by nebulization every 4 (four) hours as needed for wheezing or shortness of breath. 04/17/23   Rodriguez-Southworth, Nettie Elm, PA-C    Family History Family History  Problem Relation Age of Onset   Hypertension Mother    Diabetes Father    Cancer Father     Social History Social History   Tobacco Use   Smoking status: Never   Smokeless tobacco: Former    Quit date: 04/28/2012   Tobacco comments:    hookah pipe sometimes  Vaping Use   Vaping status: Never Used  Substance Use Topics   Alcohol use: Yes    Alcohol/week: 1.0 - 2.0 standard drink of alcohol    Types: 1 - 2 Cans of beer per week    Comment: 1-2 times a week    Drug use: No     Allergies   Patient has no known allergies.   Review of Systems Review of Systems  As noted in HPI Physical Exam Triage Vital Signs ED Triage Vitals  Encounter Vitals Group     BP 04/17/23 1700 135/85     Systolic BP Percentile --      Diastolic BP Percentile --      Pulse Rate 04/17/23 1700 99     Resp --  Temp 04/17/23 1700 98.3 F (36.8 C)     Temp Source 04/17/23 1700 Oral     SpO2 04/17/23 1700 98 %     Weight 04/17/23 1703 (!) 320 lb (145.2 kg)     Height 04/17/23 1703 6\' 5"  (1.956 m)     Head Circumference --      Peak Flow --      Pain Score 04/17/23 1703 0     Pain Loc --      Pain Education --      Exclude from Growth Chart --    No data found.  Updated Vital Signs BP 135/85 (BP Location: Left Arm)   Pulse 99   Temp 98.3 F (36.8 C) (Oral)   Ht 6\' 5"  (1.956 m)   Wt (!) 320 lb (145.2 kg)   SpO2 98%   BMI 37.95 kg/m   Visual Acuity Right Eye Distance:   Left Eye Distance:   Bilateral Distance:    Right Eye Near:   Left Eye Near:    Bilateral Near:     Physical Exam Physical  Exam Vitals signs and nursing note reviewed.  Constitutional:      General: he is not in acute distress.    Appearance: Normal appearance. He is not ill-appearing, toxic-appearing or diaphoretic.  HENT:     Head: Normocephalic.     Right Ear: Tympanic membrane, ear canal and external ear normal.     Left Ear: Tympanic membrane, ear canal and external ear normal.     Nose: Nose normal.     Mouth/Throat: clear    Mouth: Mucous membranes are moist.  Eyes:     General: No scleral icterus.       Right eye: No discharge.        Left eye: No discharge.     Conjunctiva/sclera: Conjunctivae normal.  Neck:     Musculoskeletal: Neck supple. No neck rigidity.  Cardiovascular:     Rate and Rhythm: Normal rate and regular rhythm.     Heart sounds: murmur heard. No edema Pulmonary:     Effort: has intermittent cough attacks with deep breaths and talking. This resolved after Duoneb.     Breath sounds: Normal breath sounds.  Musculoskeletal: Normal range of motion.  Lymphadenopathy:     Cervical: No cervical adenopathy.  Skin:    General: Skin is warm and dry.     Coloration: Skin is not jaundiced.     Findings: No rash.  Neurological:     Mental Status: he is alert and oriented to person, place, and time.     Gait: Gait normal.  Psychiatric:        Mood and Affect: Mood normal.        Behavior: Behavior normal.        Thought Content: Thought content normal.        Judgment: Judgment normal.     UC Treatments / Results  Labs (all labs ordered are listed, but only abnormal results are displayed) Labs Reviewed - No data to display  EKG   Radiology No results found.  Procedures Procedures (including critical care time)  Medications Ordered in UC Medications  ipratropium-albuterol (DUONEB) 0.5-2.5 (3) MG/3ML nebulizer solution 3 mL (3 mLs Nebulization Given 04/17/23 1728)    Initial Impression / Assessment and Plan / UC Course  I have reviewed the triage vital signs and the  nursing notes.  Acute bronchitis  I placed him on Doxy, Medrol and changed his  inhaler to Proair per his request. I also refilled his Albuterol ampule's.      Final Clinical Impressions(s) / UC Diagnoses   Final diagnoses:  Acute bronchitis, unspecified organism  Mild persistent asthma with acute exacerbation     Discharge Instructions      If you develop a fever or episodes of sweating, please make sure to be seen again since your will need to have a chest xray. Today we do not have a xray tech here.   Right now you do not have any typical symptoms of pneumonia.      ED Prescriptions     Medication Sig Dispense Auth. Provider   albuterol (PROVENTIL) (2.5 MG/3ML) 0.083% nebulizer solution  (Status: Discontinued) Take 3 mLs (2.5 mg total) by nebulization every 4 (four) hours as needed for wheezing or shortness of breath. 120 mL Rodriguez-Southworth, Ezra Denne, PA-C   Albuterol Sulfate, sensor, (PROAIR DIGIHALER) 108 (90 Base) MCG/ACT AEPB 2 puffs q 4-6 h prn cough attack and wheezing 1 each Rodriguez-Southworth, Eriyonna Matsushita, PA-C   doxycycline (VIBRAMYCIN) 100 MG capsule Take 1 capsule (100 mg total) by mouth 2 (two) times daily. 20 capsule Rodriguez-Southworth, Nettie Elm, PA-C   methylPREDNISolone (MEDROL DOSEPAK) 4 MG TBPK tablet Take as directed per pack 21 tablet Rodriguez-Southworth, Nettie Elm, PA-C   albuterol (PROVENTIL) (2.5 MG/3ML) 0.083% nebulizer solution Take 3 mLs (2.5 mg total) by nebulization every 4 (four) hours as needed for wheezing or shortness of breath. 120 mL Rodriguez-Southworth, Nettie Elm, PA-C      PDMP not reviewed this encounter.   Garey Ham, [place] 04/17/23 1948
